# Patient Record
Sex: Female | Born: 1990 | Race: Black or African American | Hispanic: No | Marital: Single | Smoking: Never smoker
Health system: Southern US, Community
[De-identification: ages and names within clinical notes are randomized; demographics above are authoritative.]

## PROBLEM LIST (undated history)

## (undated) ENCOUNTER — Inpatient Hospital Stay: Payer: Self-pay

## (undated) DIAGNOSIS — O99210 Obesity complicating pregnancy, unspecified trimester: Secondary | ICD-10-CM

## (undated) DIAGNOSIS — R7303 Prediabetes: Secondary | ICD-10-CM

## (undated) DIAGNOSIS — I10 Essential (primary) hypertension: Secondary | ICD-10-CM

## (undated) DIAGNOSIS — O139 Gestational [pregnancy-induced] hypertension without significant proteinuria, unspecified trimester: Secondary | ICD-10-CM

## (undated) DIAGNOSIS — Z789 Other specified health status: Secondary | ICD-10-CM

## (undated) HISTORY — PX: NO PAST SURGERIES: SHX2092

## (undated) HISTORY — DX: Gestational (pregnancy-induced) hypertension without significant proteinuria, unspecified trimester: O13.9

## (undated) HISTORY — DX: Essential (primary) hypertension: I10

## (undated) HISTORY — DX: Prediabetes: R73.03

---

## 2011-03-27 ENCOUNTER — Emergency Department: Payer: Self-pay | Admitting: Internal Medicine

## 2012-10-05 IMAGING — CT CT CERVICAL SPINE WITHOUT CONTRAST
2 series · 15 of 20 positions shown, 18 images · non-contrast
Comparison: none

REASON FOR EXAM: neck pain
COMMENTS:

PROCEDURE:     CT  - CT CERVICAL SPINE WO  - March 27, 2011  [DATE]
RESULT:     CT cervical spine
TECHNIQUE: Helical 2 mm sections were obtained. Reconstructions were
performed utilizing a bone algorithm in coronal, sagittal, and axial planes.

[Series 5: coronal · coronal · 0.34mm/px · 3 of 52 slices shown]
[im 11/52  bone]
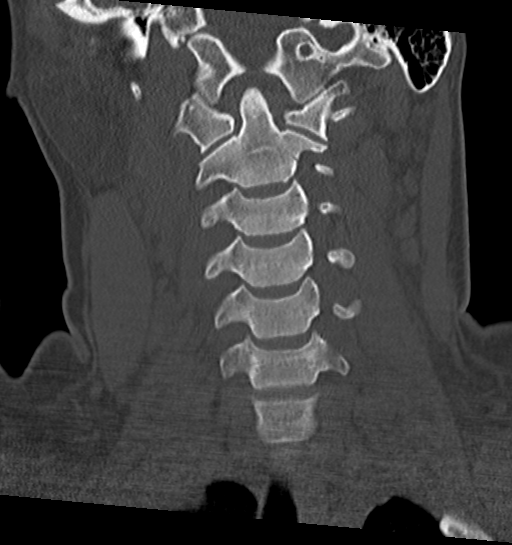
[im 21/52  bone]
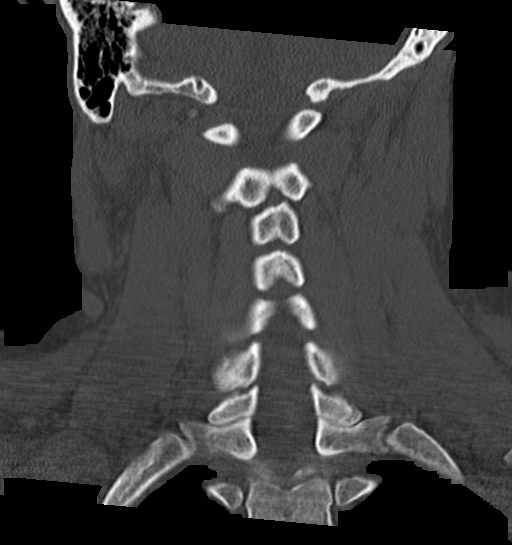
[im 31/52  bone]
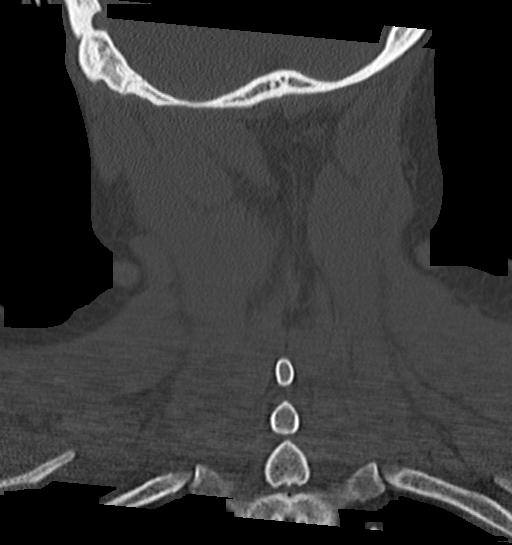

[Series 7: axial · axial · 0.31mm/px · z∈[-730,-596]mm · 12 of 81 slices shown, 15 images]
[im 7/81  soft-tissue]
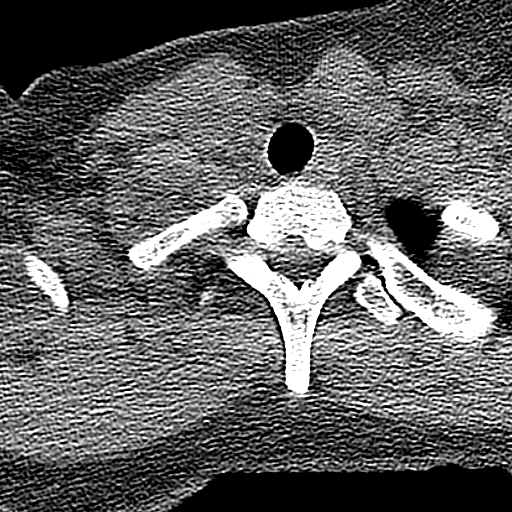
[im 7/81  bone]
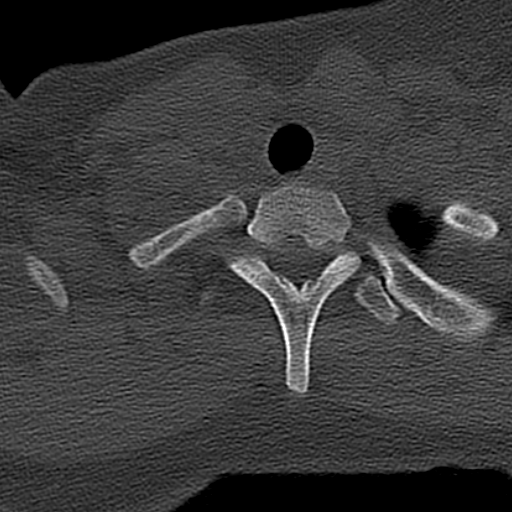
[im 13/81  bone]
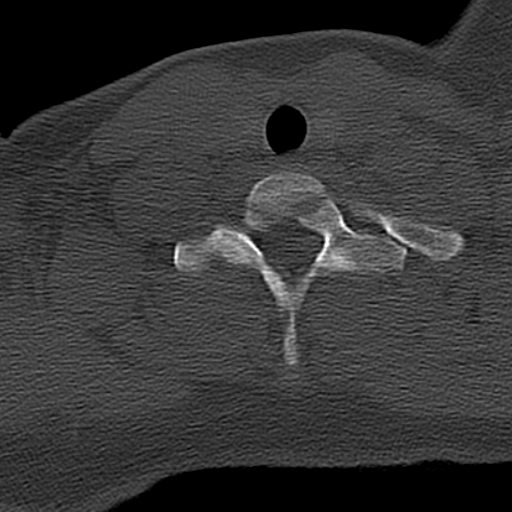
[im 19/81  bone]
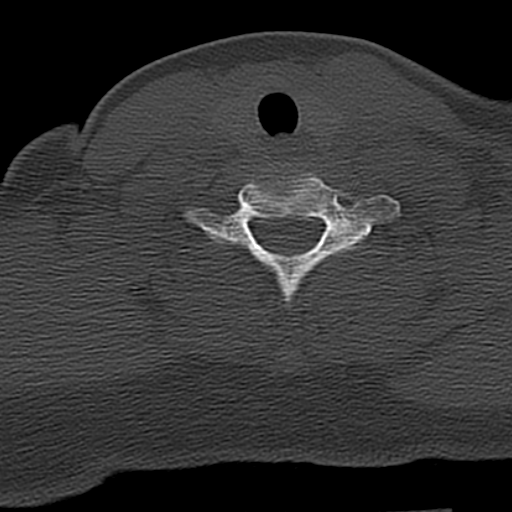
[im 25/81  bone]
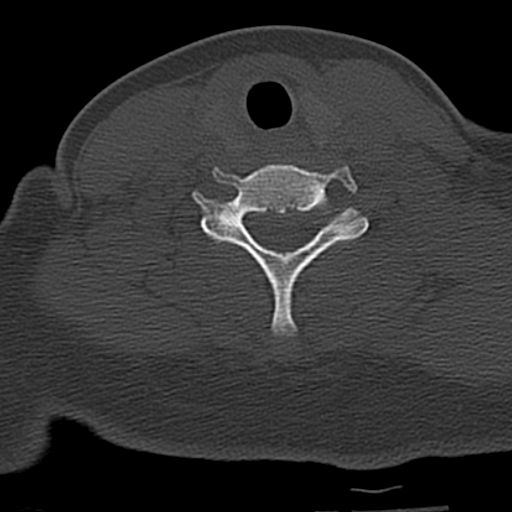
[im 31/81  soft-tissue]
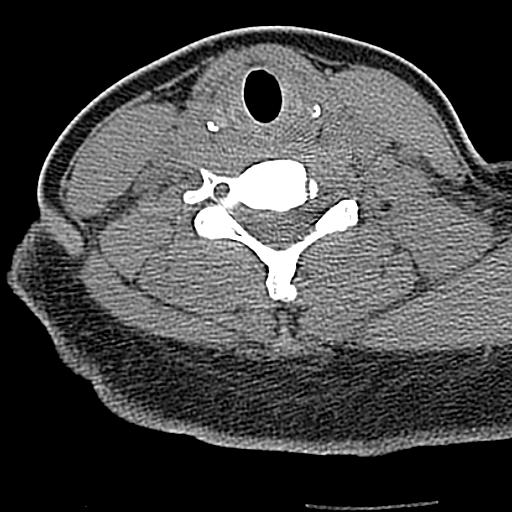
[im 31/81  bone]
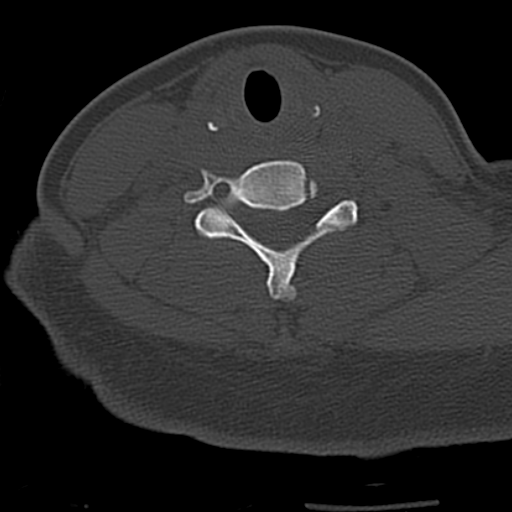
[im 37/81  bone]
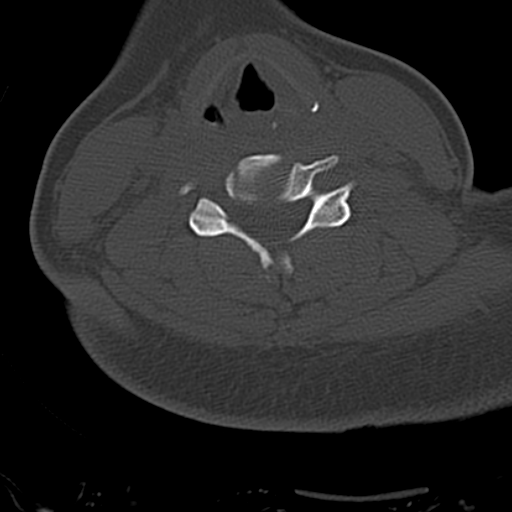
[im 44/81  bone]
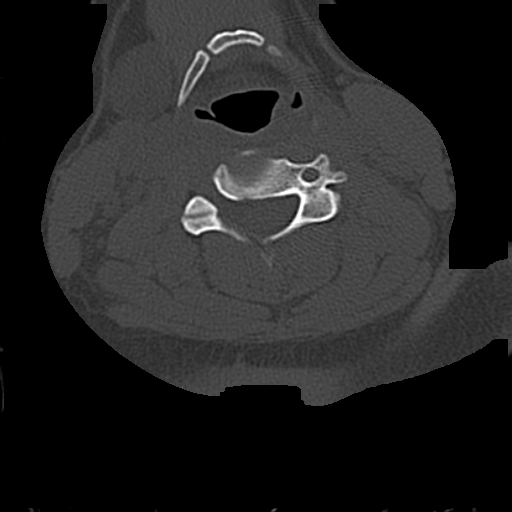
[im 50/81  bone]
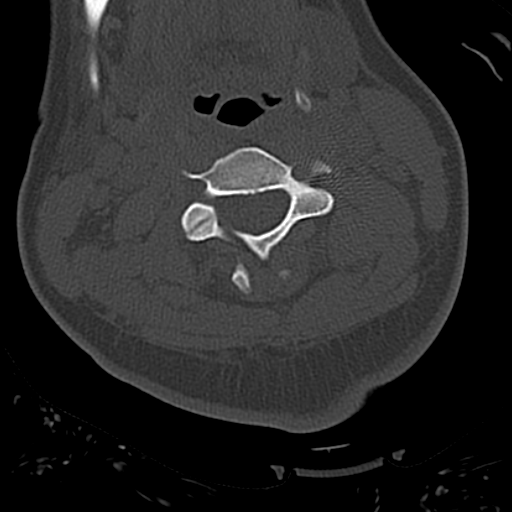
[im 56/81  soft-tissue]
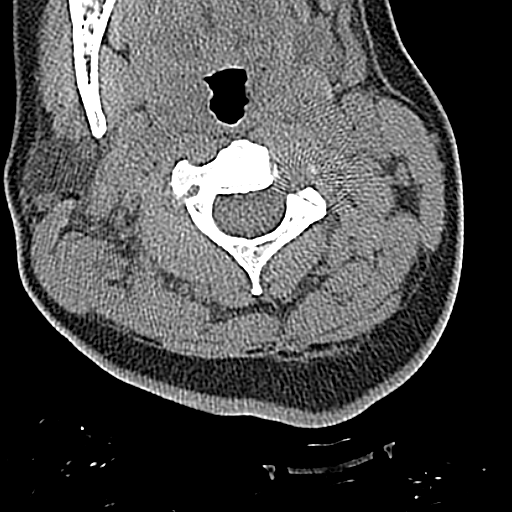
[im 56/81  bone]
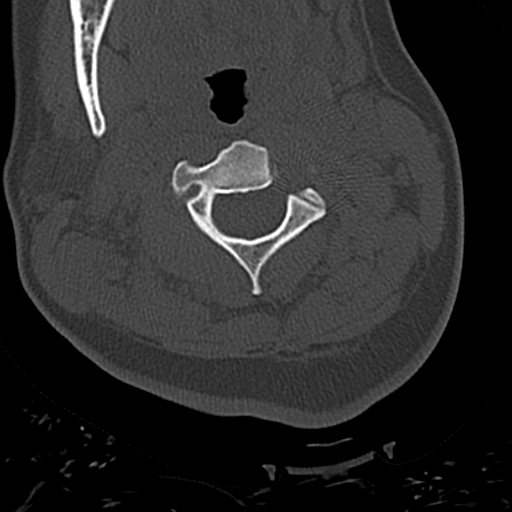
[im 62/81  bone]
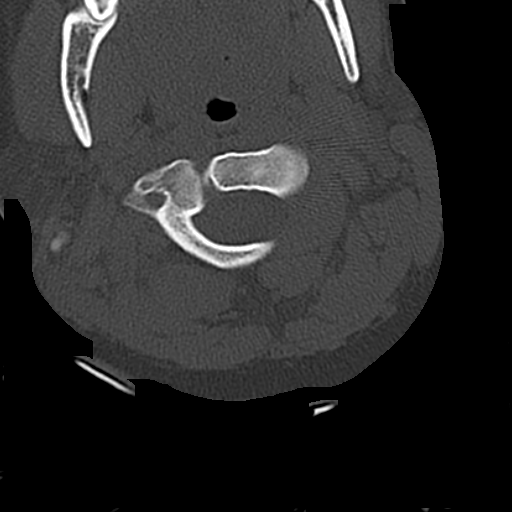
[im 68/81  bone]
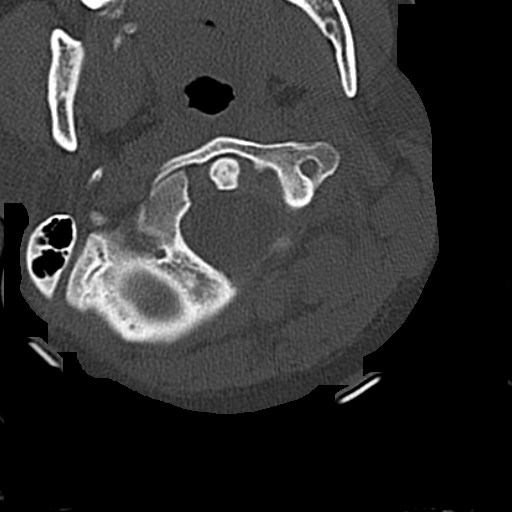
[im 74/81  bone]
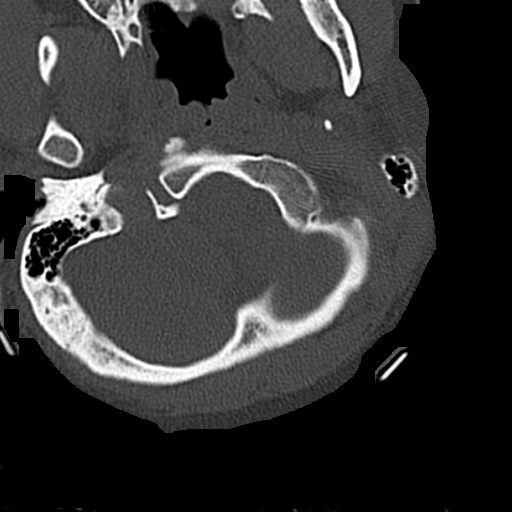

[15 of 20 positions shown; findings below may reference images not displayed]

FINDINGS: There is no evidence of acute fracture, dislocation, or
malalignment. There is no evidence of prevertebral soft tissue swelling nor
evidence of canal stenosis.
IMPRESSION: No CT evidence of acute osseous abnormalities.

## 2014-07-09 ENCOUNTER — Emergency Department: Payer: Self-pay | Admitting: Emergency Medicine

## 2015-02-14 LAB — OB RESULTS CONSOLE HEPATITIS B SURFACE ANTIGEN: Hepatitis B Surface Ag: NEGATIVE

## 2015-02-14 LAB — OB RESULTS CONSOLE GC/CHLAMYDIA
CHLAMYDIA, DNA PROBE: NEGATIVE
Gonorrhea: NEGATIVE

## 2015-03-07 LAB — OB RESULTS CONSOLE RUBELLA ANTIBODY, IGM: Rubella: IMMUNE

## 2015-03-07 LAB — OB RESULTS CONSOLE VARICELLA ZOSTER ANTIBODY, IGG: Varicella: IMMUNE

## 2015-05-12 DIAGNOSIS — O99210 Obesity complicating pregnancy, unspecified trimester: Secondary | ICD-10-CM | POA: Insufficient documentation

## 2015-05-12 HISTORY — DX: Obesity complicating pregnancy, unspecified trimester: O99.210

## 2015-05-12 NOTE — L&D Delivery Note (Signed)
Delivery Note At 10:04 AM a viable female was delivered via Vaginal, Spontaneous Delivery (Presentation: ; Occiput Anterior).  APGAR: 8, 9; weight  .   Placenta status: Intact, Spontaneous.  Cord: 3 vessels with the following complications: None.  Cord pH: n/a  Anesthesia: Epidural  Episiotomy: None Lacerations: None Suture Repair: none Est. Blood Loss (mL): 200  Mom to postpartum.  Baby to Couplet care / Skin to Skin.  Quick second stage after laboring down for over an hour. Infant to maternal abdomen for delayed cord clamping, cut by FOB after pulsation stopped. Infant to skin-to-skin with mother.   Baby's Name: August  Brandilyn Nanninga 09/11/2015, 10:25 AM

## 2015-06-13 LAB — OB RESULTS CONSOLE RPR: RPR: NONREACTIVE

## 2015-06-13 LAB — OB RESULTS CONSOLE HIV ANTIBODY (ROUTINE TESTING): HIV: NONREACTIVE

## 2015-08-09 ENCOUNTER — Other Ambulatory Visit: Payer: Self-pay | Admitting: Advanced Practice Midwife

## 2015-08-09 DIAGNOSIS — E348 Other specified endocrine disorders: Secondary | ICD-10-CM

## 2015-08-19 ENCOUNTER — Ambulatory Visit
Admission: RE | Admit: 2015-08-19 | Discharge: 2015-08-19 | Disposition: A | Discharge status: Home / Self Care | Payer: Medicaid Other | Source: Ambulatory Visit | Attending: Maternal & Fetal Medicine | Admitting: Maternal & Fetal Medicine

## 2015-08-19 ENCOUNTER — Ambulatory Visit: Payer: Self-pay

## 2015-08-19 DIAGNOSIS — E348 Other specified endocrine disorders: Secondary | ICD-10-CM | POA: Insufficient documentation

## 2015-08-19 DIAGNOSIS — Z3A36 36 weeks gestation of pregnancy: Secondary | ICD-10-CM | POA: Diagnosis not present

## 2015-08-19 DIAGNOSIS — O26893 Other specified pregnancy related conditions, third trimester: Secondary | ICD-10-CM | POA: Insufficient documentation

## 2015-08-19 HISTORY — DX: Obesity complicating pregnancy, unspecified trimester: O99.210

## 2015-08-22 LAB — OB RESULTS CONSOLE GBS: STREP GROUP B AG: NEGATIVE

## 2015-08-28 ENCOUNTER — Emergency Department
Admission: EM | Admit: 2015-08-28 | Discharge: 2015-08-29 | Disposition: A | Discharge status: Home / Self Care | Payer: Medicaid Other | Source: Home / Self Care | Attending: Emergency Medicine | Admitting: Emergency Medicine

## 2015-08-28 ENCOUNTER — Encounter: Payer: Self-pay | Admitting: *Deleted

## 2015-08-28 DIAGNOSIS — E669 Obesity, unspecified: Secondary | ICD-10-CM | POA: Diagnosis not present

## 2015-08-28 DIAGNOSIS — O99713 Diseases of the skin and subcutaneous tissue complicating pregnancy, third trimester: Secondary | ICD-10-CM | POA: Diagnosis not present

## 2015-08-28 DIAGNOSIS — Z6841 Body Mass Index (BMI) 40.0 and over, adult: Secondary | ICD-10-CM | POA: Diagnosis not present

## 2015-08-28 DIAGNOSIS — O1203 Gestational edema, third trimester: Secondary | ICD-10-CM

## 2015-08-28 DIAGNOSIS — Z3A37 37 weeks gestation of pregnancy: Secondary | ICD-10-CM | POA: Insufficient documentation

## 2015-08-28 DIAGNOSIS — R21 Rash and other nonspecific skin eruption: Secondary | ICD-10-CM | POA: Insufficient documentation

## 2015-08-28 DIAGNOSIS — O99213 Obesity complicating pregnancy, third trimester: Secondary | ICD-10-CM | POA: Insufficient documentation

## 2015-08-28 LAB — CBC
HCT: 34.3 % — ABNORMAL LOW (ref 35.0–47.0)
HEMOGLOBIN: 11.3 g/dL — AB (ref 12.0–16.0)
MCH: 25.7 pg — ABNORMAL LOW (ref 26.0–34.0)
MCHC: 33 g/dL (ref 32.0–36.0)
MCV: 77.9 fL — ABNORMAL LOW (ref 80.0–100.0)
PLATELETS: 260 10*3/uL (ref 150–440)
RBC: 4.41 MIL/uL (ref 3.80–5.20)
RDW: 14.4 % (ref 11.5–14.5)
WBC: 14.3 10*3/uL — AB (ref 3.6–11.0)

## 2015-08-28 LAB — COMPREHENSIVE METABOLIC PANEL
ALBUMIN: 2.8 g/dL — AB (ref 3.5–5.0)
ALK PHOS: 101 U/L (ref 38–126)
ALT: 25 U/L (ref 14–54)
ANION GAP: 8 (ref 5–15)
AST: 32 U/L (ref 15–41)
BUN: 10 mg/dL (ref 6–20)
CALCIUM: 9.2 mg/dL (ref 8.9–10.3)
CHLORIDE: 105 mmol/L (ref 101–111)
CO2: 22 mmol/L (ref 22–32)
CREATININE: 0.72 mg/dL (ref 0.44–1.00)
GFR calc Af Amer: >60 mL/min (ref >60)
GFR calc non Af Amer: >60 mL/min (ref >60)
GLUCOSE: 109 mg/dL — AB (ref 65–99)
Potassium: 3.8 mmol/L (ref 3.5–5.1)
SODIUM: 135 mmol/L (ref 135–145)
Total Bilirubin: 0.5 mg/dL (ref 0.3–1.2)
Total Protein: 6.8 g/dL (ref 6.5–8.1)

## 2015-08-28 NOTE — ED Provider Notes (Signed)
[facility] Emergency Department Provider Note  ____________________________________________  Time seen: 11:10 PM  I have reviewed the triage vital signs and the nursing notes.   HISTORY  Chief Complaint Leg Swelling      HPI Nichole Lang is a 25 y.o. female Gravida 1 para 0 recent weeks pregnant presents to the emergency department with bilateral extremity swelling that has progressed over the past 2 days accompanied by a "stinging rash". Patient denies any abdominal or pelvic discomfort no vaginal bleeding. Patient denies any chest pain or shortness of breath. Patient denies any personal or family history of DVT or PE. Patient states that the pregnancy has been uneventful thus far.   Past Medical History  Diagnosis Date  . Obesity affecting pregnancy 2017    There are no active problems to display for this patient.   Past Surgical History  Procedure Laterality Date  . No past surgeries      Current Outpatient Rx  Name  Route  Sig  Dispense  Refill  . Prenat w/o A Vit-FeFum-FePo-FA (PROVIDA OB PO)   Oral   Take 1 tablet by mouth daily.           Allergies No known drug allergies No family history on file.  Social History Social History  Substance Use Topics  . Smoking status: Never Smoker   . Smokeless tobacco: Never Used  . Alcohol Use: No    Review of Systems  Constitutional: Negative for fever. Eyes: Negative for visual changes. ENT: Negative for sore throat. Cardiovascular: Negative for chest pain. Respiratory: Negative for shortness of breath. Gastrointestinal: Negative for abdominal pain, vomiting and diarrhea. Genitourinary: Negative for dysuria. Musculoskeletal: Negative for back pain.Positive for bilateral lower extremity swelling Skin: Positive for rash. Neurological: Negative for headaches, focal weakness or numbness.   10-point ROS otherwise  negative.  ____________________________________________   PHYSICAL EXAM:  VITAL SIGNS: ED Triage Vitals  Enc Vitals Group     BP 08/28/15 2046 135/82 mmHg     Pulse Rate 08/28/15 2046 88     Resp 08/28/15 2046 20     Temp 08/28/15 2046 98.3 F (36.8 C)     Temp Source 08/28/15 2046 Oral     SpO2 08/28/15 2046 98 %     Weight 08/28/15 2046 280 lb (127.007 kg)     Height 08/28/15 2046 5\' 7"  (1.702 m)     Head Cir --      Peak Flow --      Pain Score 08/28/15 2247 4     Pain Loc --      Pain Edu? --      Excl. in GC? --    Constitutional: Alert and oriented. Well appearing and in no distress. Eyes: Conjunctivae are normal. PERRL. Normal extraocular movements. ENT   Head: Normocephalic and atraumatic.   Nose: No congestion/rhinnorhea.   Mouth/Throat: Mucous membranes are moist.   Neck: No stridor. Hematological/Lymphatic/Immunilogical: No cervical lymphadenopathy. Cardiovascular: Normal rate, regular rhythm. Normal and symmetric distal pulses are present in all extremities. No murmurs, rubs, or gallops. Respiratory: Normal respiratory effort without tachypnea nor retractions. Breath sounds are clear and equal bilaterally. No wheezes/rales/rhonchi. Gastrointestinal: Soft and nontender. No distention. There is no CVA tenderness. Genitourinary: deferred Musculoskeletal: Nontender with normal range of motion in all extremities. No joint effusions.Bilateral lower extremity nonpitting edema  Neurologic:  Normal speech and language. No gross focal neurologic deficits are appreciated. Speech is normal.  Skin:  Skin is warm, dry and intact.  Positive for bilateral lower extremity nonblanching erythematous tender rash Psychiatric: Mood and affect are normal. Speech and behavior are normal. Patient exhibits appropriate insight and judgment.  ____________________________________________    LABS (pertinent positives/negatives)  Labs Reviewed  CBC - Abnormal; Notable for the  following:    WBC 14.3 (*)    Hemoglobin 11.3 (*)    HCT 34.3 (*)    MCV 77.9 (*)    MCH 25.7 (*)    All other components within normal limits  COMPREHENSIVE METABOLIC PANEL - Abnormal; Notable for the following:    Glucose, Bld 109 (*)    Albumin 2.8 (*)    All other components within normal limits       RADIOLOGY US Venous Img Lower Bilateral (Final result) Result time: 08/29/15 02:35:09   Final result by Rad Results In Interface (08/29/15 02:35:09)   Narrative:   CLINICAL DATA: Pregnant patient in third trimester pregnancy with bilateral leg swelling.  EXAM: BILATERAL LOWER EXTREMITY VENOUS DOPPLER ULTRASOUND  TECHNIQUE: Gray-scale sonography with graded compression, as well as color Doppler and duplex ultrasound were performed to evaluate the lower extremity deep venous systems from the level of the common femoral vein and including the common femoral, femoral, profunda femoral, popliteal and calf veins including the posterior tibial, peroneal and gastrocnemius veins when visible. The superficial great saphenous vein was also interrogated. Spectral Doppler was utilized to evaluate flow at rest and with distal augmentation maneuvers in the common femoral, femoral and popliteal veins.  COMPARISON: None.  FINDINGS: RIGHT LOWER EXTREMITY  Common Femoral Vein: No evidence of thrombus. Normal compressibility, respiratory phasicity and response to augmentation.  Saphenofemoral Junction: No evidence of thrombus. Normal compressibility and flow on color Doppler imaging.  Profunda Femoral Vein: No evidence of thrombus. Normal compressibility and flow on color Doppler imaging.  Femoral Vein: No evidence of thrombus. Normal compressibility, respiratory phasicity and response to augmentation.  Popliteal Vein: No evidence of thrombus. Normal compressibility, respiratory phasicity and response to augmentation.  Calf Veins: No evidence of thrombus. Normal  compressibility and flow on color Doppler imaging.  Superficial Great Saphenous Vein: No evidence of thrombus. Normal compressibility and flow on color Doppler imaging.  Venous Reflux: None.  Other Findings: None.  LEFT LOWER EXTREMITY  Common Femoral Vein: No evidence of thrombus. Normal compressibility, respiratory phasicity and response to augmentation.  Saphenofemoral Junction: No evidence of thrombus. Normal compressibility and flow on color Doppler imaging.  Profunda Femoral Vein: No evidence of thrombus. Normal compressibility and flow on color Doppler imaging.  Femoral Vein: No evidence of thrombus. Normal compressibility, respiratory phasicity and response to augmentation.  Popliteal Vein: No evidence of thrombus. Normal compressibility, respiratory phasicity and response to augmentation.  Calf Veins: No evidence of thrombus. Normal compressibility and flow on color Doppler imaging.  Superficial Great Saphenous Vein: No evidence of thrombus. Normal compressibility and flow on color Doppler imaging.  Venous Reflux: None.  Other Findings: None.  IMPRESSION: No evidence of bilateral lower extremity deep venous thrombosis.   Electronically Signed By: Rubye Oaks M.D. On: 08/29/2015 02:35       INITIAL IMPRESSION / ASSESSMENT AND PLAN / ED COURSE  Pertinent labs & imaging results that were available during my care of the patient were reviewed by me and considered in my medical decision making (see chart for details).  Suspect bilateral extremity rash to be Purigo. Patient discussed with [facility] nurse practitioner Sharen Hones who accepted the patient to be discharged to labor and delivery  ____________________________________________  FINAL CLINICAL IMPRESSION(S) / ED DIAGNOSES  Final diagnoses:  Edema in pregnancy in third trimester      Darci Current, MD 08/29/15 [id number]

## 2015-08-28 NOTE — ED Notes (Signed)
Pt is [redacted] weeks pregnant.  Pt has swelling and rash to both lower legs for 2 days.  No abd pain. No vag bleeding. No chest pain.  No sob.  [id number]

## 2015-08-29 ENCOUNTER — Observation Stay
Admission: RE | Admit: 2015-08-29 | Discharge: 2015-08-29 | Disposition: A | Discharge status: Home / Self Care | Payer: Medicaid Other | Attending: Certified Nurse Midwife | Admitting: Certified Nurse Midwife

## 2015-08-29 ENCOUNTER — Emergency Department: Payer: Medicaid Other

## 2015-08-29 DIAGNOSIS — O1203 Gestational edema, third trimester: Secondary | ICD-10-CM | POA: Diagnosis present

## 2015-08-29 HISTORY — DX: Other specified health status: Z78.9

## 2015-08-29 NOTE — Discharge Instructions (Signed)
Peripheral Edema You have swelling in your legs (peripheral edema). This swelling is due to excess accumulation of salt and water in your body. Edema may be a sign of heart, kidney or liver disease, or a side effect of a medication. It may also be due to problems in the leg veins. Elevating your legs and using special support stockings may be very helpful, if the cause of the swelling is due to poor venous circulation. Avoid long periods of standing, whatever the cause. Treatment of edema depends on identifying the cause. Chips, pretzels, pickles and other salty foods should be avoided. Restricting salt in your diet is almost always needed. Water pills (diuretics) are often used to remove the excess salt and water from your body via urine. These medicines prevent the kidney from reabsorbing sodium. This increases urine flow. Diuretic treatment may also result in lowering of potassium levels in your body. Potassium supplements may be needed if you have to use diuretics daily. Daily weights can help you keep track of your progress in clearing your edema. You should call your caregiver for follow up care as recommended. SEEK IMMEDIATE MEDICAL CARE IF:   You have increased swelling, pain, redness, or heat in your legs.  You develop shortness of breath, especially when lying down.  You develop chest or abdominal pain, weakness, or fainting.  You have a fever.   This information is not intended to replace advice given to you by your health care provider. Make sure you discuss any questions you have with your health care provider.   Document Released: 06/04/2004 Document Revised: 07/20/2011 Document Reviewed: 11/07/2014 Elsevier Interactive Patient Education 2016 Else[employer] Inc[employer]  Skin Conditions During Pregnancy Pregnancy affects many parts of your body. One part is your skin. Most skin problems that develop during pregnancy are not serious and are considered a normal part of pregnancy. They go away on  their own after the baby is born. Other skin problems may need treatment.  WHAT TYPE OF SKIN PROBLEMS CAN DEVELOP DURING PREGNANCY?  Stretch marks. Stretch marks are purple or pink lines on the skin. They may appear on the belly, breasts, thighs, or buttocks. Stretch marks are caused by weight gain that causes the skin to stretch. Stretch marks do not cause problems. Almost all women get them during pregnancy.  Darkening of the skin (hyperpigmentation). The darkening may occur in patches or as a line. Patches may appear on the face, nipples, or genital area. Lines often stretch from the belly button to the pubic area. Hyperpigmentation develops in almost all pregnant women. It is more severe in women with a dark complexion.  Spider angiomas. These are tiny pink or red lines that go out from a center point, like the legs of a spider. Usually, they are on the face, neck, and arms. They do not cause problems. They are most common in women with light complexions.  Palmar erythema. This is a reddening of the palms. It is most common in women with light complexions.  Swelling and redness. This can occur on the face, eyelids, fingers, or toes.  Pruritic urticarial papules and plaques of pregnancy (PUPPP). This is a rash that is itchy, red, and has tiny blisters. The cause is unknown. It usually starts on the abdomen and may affect the arms or legs. It does not affect the face. It usually begins later in pregnancy. About a third of all pregnant women develop this condition. There are no associated problems to the fetus with this rash.  Sometimes, oral steroids are used to calm down the itch. The rash clears after the baby is born.  Prurigo of pregnancy. This is a disease in which red patches and bumps appear on the arms and legs. The cause is unknown. The patches and bumps clear after the baby is born. About a third of pregnant women develop this disease.  Acne. Pimples may develop, including in women who  have had clear skin for a long time.  Skin tags. These are small flaps of skin that stick out from the body. They may grow or become darker during pregnancy. They are usually harmless.  Moles. These are flat or slightly raised growths. They are usually round and pink or Nichole Lang. They may grow or become darker during pregnancy.  Intrahepatic cholestasis of pregnancy. This is a rare condition that causes itchy skin. It may run in families. It increases the risk of complications for the fetus. This condition usually resolves after delivery. It can recur with subsequent pregnancies.  Impetigo herpetiformis. This is a form of a severe skin disease called pustular psoriasis. Usually, delivery is the only method of resolving the condition.  Pruritic folliculitis of pregnancy. This is a rare condition that causes pimple-like skin growths. It develops in the middle or later stages of pregnancy. Its cause is unknown.It usually resolves 2-3 weeks after delivery.  Pemphigoid gestationis. This is a very rare autoimmune disease. It causes a severely itchy rash and blisters. The rash does not appear on the face, scalp, or inside of the mouth. It usually resolves 3 months after delivery. It may recur with subsequent pregnancies. Some pre-existing skin conditions, such as atopic dermatitis, may become worse during pregnancy.  HOME CARE INSTRUCTIONS  Different conditions may have different instructions. In general:  Follow all your health care provider's directions about medicines to treat skin problems while you are pregnant. Do not use any over-the-counter medicines (including medicated creams and lotions) until you have checked with your health care provider. Many medicines are not safe to use when you are pregnant.  Avoid time in the sun. This will help keep your skin from darkening. When you must be outside, use sunscreen and wear a hat with a wide brim to protect your face. The sunscreen should have a SPF of  at least 15. This may help limit dark spots that develop when the skin is exposed to the sun.  To avoid problems from stretched skin:  Do not sit or stand for long periods of time.  Exercise regularly. This helps keep your skin in good condition.  Use a gentle soap. This helps prevent acne.  Do not get too hot or too sweaty. This makes some skin rashes worse.  Wear loose clothes made of a soft fabric. This prevents skin irritation.  For itching, add oatmeal or cornstarch to your bathwater.  Use a skin moisturizer. Ask your health care provider for suggestions.   This information is not intended to replace advice given to you by your health care provider. Make sure you discuss any questions you have with your health care provider.   Document Released: 05/30/2010 Document Revised: 05/18/2014 Document Reviewed: 02/06/2013 Elsevier Interactive Patient Education [employer]

## 2015-08-29 NOTE — Discharge Instructions (Signed)
Lab results reviewed, ultrasound to evaluate for DVT done in ER, neg. NST reactive. Pt confirms active fetal movement, denies feeling contractions. VSS, afebrile. Deferred vaginal exam since pt has not c/o labor signs/symptoms. Discharge teaching with labor precautions reviewed with pt and family present. Encouraged pt to drink plenty fluids throughout the day and night to stay well hydrated, keep legs elevated above level of heart whenever possible throughout the day. Avoid strenuous activity and soak ankle in Epson Salts once or twice daily. Obtain maternity disability paperwork and provide to OB to have paperwork completed. Understanding verbalized.

## 2015-08-29 NOTE — OB Triage Note (Signed)
Pt presents to OBS with c/o worsening edema in both legs and feet which was present for the past 2-3 weeks causing pain to touch and with ambulating/standing, rates pain 4-6/10, burning sensation. Noticed reddened rash on both legs and ankles that has been spreading and looks worse over the past couple days. Says she was seen in ER tonight, had lab work and ultrasound done and was cleared for DVT. 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 Confirms active fetal movement and has back pain which is normal for her, denies ha pain, dizziness, vision changes, sob, chest pain, urinary sym, unusual vaginal discharge, spotting, leaking or gush of fluid, fever, chill, n/v/d. GBS collected in clinic last visit, pt says she was not told results.

## 2015-08-29 NOTE — Final Progress Note (Signed)
Physician Final Progress Note  Patient ID: Nichole Lang MRN: [id number] DOB/AGE: 27/08/1990 27 y.o.  Admit date: 08/29/2015 Admitting provider: Leola Brazilhelsea C Ward, MD Discharge date: 08/29/2015   Admission Diagnoses: IUP at 37wk4d Edema of lower extremities Rash lower extremities  Discharge Diagnoses:  SAME AS ABOVE  Consults: NONE  Significant Findings/ Diagnostic Studies:   25 YEAR OLD G1 P0 with EDC=09/15/2015 by a 12wk4d ultrasound presents at 37.3 weeks with complaints of painful lower extremity edema and rash. She was seen in the ER and a Doppler study was done to rule out DVT. There was no evidence of DVT. Blood pressures were normal. She was sent up to L&D for a NST which was reactive.  Rash is not pruritic and is only on her ankles. [facility] has been remarkable for obesity (BMI>40), mild anemia, an elevated 1 hour GTT with normal 3hr GTT, and concerns for fetal growth. Last ultrasound with DP 4/10 placed EFW in the 14th%. She is having NSTs and AFIs weekly now. Blood type AB neg and she received Rhogam at 28 weeks. Exam: BP 133/75 mmHg  Pulse 87  Temp(Src) 98.3 F (36.8 C) (Oral)  Ht 5\' 7"  (1.702 m)  Wt 127.007 kg (280 lb)  BMI 43.84 kg/m2  LMP  (LMP Unknown)  FHR: 135 with accelerations to 150s, moderate variability Lower extremities: edema of lower legs and feet with a red macular rash on the medial ankles  Procedures: NST-reactive  Discharge Condition: stable  Disposition: [name]  Diet: Regular diet  Discharge Activity: Activity as tolerated, can soak feet in water twice daily with epsom salt. Elevate feet as much as possible. Note given to start maternity leave.     Medication List    ASK your doctor about these medications        PROVIDA OB PO  Take 1 tablet by mouth daily.         Total time spent taking care of this patient: 15 minutes  Signed: Jolette Lana 08/29/2015, 5:32 AM

## 2015-08-30 ENCOUNTER — Observation Stay
Admission: EM | Admit: 2015-08-30 | Discharge: 2015-08-30 | Disposition: A | Discharge status: Home / Self Care | Payer: Medicaid Other | Attending: Advanced Practice Midwife | Admitting: Advanced Practice Midwife

## 2015-08-30 DIAGNOSIS — Z6841 Body Mass Index (BMI) 40.0 and over, adult: Secondary | ICD-10-CM | POA: Diagnosis not present

## 2015-08-30 DIAGNOSIS — O471 False labor at or after 37 completed weeks of gestation: Secondary | ICD-10-CM | POA: Diagnosis not present

## 2015-08-30 DIAGNOSIS — O1203 Gestational edema, third trimester: Principal | ICD-10-CM | POA: Insufficient documentation

## 2015-08-30 DIAGNOSIS — Z3A37 37 weeks gestation of pregnancy: Secondary | ICD-10-CM | POA: Insufficient documentation

## 2015-08-30 DIAGNOSIS — E669 Obesity, unspecified: Secondary | ICD-10-CM | POA: Insufficient documentation

## 2015-08-30 DIAGNOSIS — O99213 Obesity complicating pregnancy, third trimester: Secondary | ICD-10-CM | POA: Diagnosis not present

## 2015-08-30 DIAGNOSIS — O9989 Other specified diseases and conditions complicating pregnancy, childbirth and the puerperium: Secondary | ICD-10-CM | POA: Insufficient documentation

## 2015-08-30 DIAGNOSIS — R030 Elevated blood-pressure reading, without diagnosis of hypertension: Secondary | ICD-10-CM | POA: Diagnosis present

## 2015-08-30 LAB — PROTEIN / CREATININE RATIO, URINE
Creatinine, Urine: 96 mg/dL
Protein Creatinine Ratio: 0.16 mg/mg{Cre} — ABNORMAL HIGH (ref 0.00–0.15)
Total Protein, Urine: 15 mg/dL

## 2015-08-30 NOTE — OB Triage Note (Signed)
Ms. Nichole Lang here with elevated BP from office, reports previous admission for rash on lower extremities

## 2015-08-30 NOTE — Discharge Instructions (Signed)

## 2015-08-30 NOTE — Final Progress Note (Signed)
Obstetric History and Physical  Nichole Lang is a 25 y.o. G1P0 with Estimated Date of Delivery: 09/15/15 who presents at 50103w5d  presenting for elevated BP in office today of 132/90 & 150/100. Pt denies ha, visual changes. 1 prior elevated BP at 16 week OB visit. Patient states she has been having rare contractions, no vaginal bleeding, intact membranes, with active fetal movement.    Prenatal Course Source of Care: [facility]   Pregnancy complications or risks: Patient Active Problem List   Diagnosis Date Noted  . Elevated blood pressure reading 08/30/2015  . Edema during pregnancy in third trimester 08/29/2015   Obesity with BMI>40 Concern for IUGR, but [facility] Recent evaluation for LE swelling and rash - neg LE dopplars   Prenatal Transfer Tool   Past Medical History  Diagnosis Date  . Obesity affecting pregnancy 2017  . Medical history non-contributory     Past Surgical History  Procedure Laterality Date  . No past surgeries      OB History  Gravida Para Term Preterm AB SAB TAB Ectopic Multiple Living  1             # Outcome Date GA Lbr Len/2nd Weight Sex Delivery Anes PTL Lv  1 Current               Social History   Social History  . Marital Status: Single    Spouse Name: N/A  . Number of Children: N/A  . Years of Education: N/A   Social History Main Topics  . Smoking status: Never Smoker   . Smokeless tobacco: Never Used  . Alcohol Use: No  . Drug Use: No  . Sexual Activity: Not Currently   Other Topics Concern  . Not on file   Social History Narrative    No family history on file.  Prescriptions prior to admission  Medication Sig Dispense Refill Last Dose  . Prenat w/o A Vit-FeFum-FePo-FA (PROVIDA OB PO) Take 1 tablet by mouth daily.   08/28/2015 at Unknown time    Allergies  Allergen Reactions  . No Known Allergies     Review of Systems: Negative except for what is mentioned in HPI.  Physical Exam: BPs: normal to mild  range except 1 diastolic of 92 BP 127/78 mmHg  Pulse 87  Temp(Src) 98.2 F (36.8 C) (Oral)  Ht 5\' 7"  (1.702 m)  Wt 283 lb (128.368 kg)  BMI 44.31 kg/m2  LMP  (LMP Unknown) GENERAL: Well-developed, well-nourished female in no acute distress.  ABDOMEN: Soft, nontender, nondistended, gravid. EXTREMITIES: Nontender, no edema Cervical Exam: Dilatation 2-2.5cm   Effacement 70%   Station -3, posterior   Presentation: cephalic FHT: Category: 1 Baseline rate 130 bpm   Variability moderate  Accelerations present   Decelerations none Contractions: rare   Pertinent Labs/Studies:   Results for orders placed or performed during the hospital encounter of 08/30/15 (from the past 24 hour(s))  Protein / creatinine ratio, urine     Status: Abnormal   Collection Time: 08/30/15 11:30 AM  Result Value Ref Range   Creatinine, Urine 96 mg/dL   Total Protein, Urine 15 mg/dL   Protein Creatinine Ratio 0.16 (H) 0.00 - 0.15 mg/mg[Cre]    Assessment : IUP at 64103w5d, no evidence of pre-eclampsia  Plan: Discharge Home  F/u at office as scheduled on 4/24 Pre-eclampsia and labor precautions

## 2015-09-10 ENCOUNTER — Inpatient Hospital Stay
Admission: EM | Admit: 2015-09-10 | Discharge: 2015-09-13 | DRG: 774 | Disposition: A | Discharge status: Home / Self Care | Payer: Medicaid Other | Attending: Advanced Practice Midwife | Admitting: Advanced Practice Midwife

## 2015-09-10 DIAGNOSIS — Z6841 Body Mass Index (BMI) 40.0 and over, adult: Secondary | ICD-10-CM | POA: Diagnosis not present

## 2015-09-10 DIAGNOSIS — E669 Obesity, unspecified: Secondary | ICD-10-CM | POA: Diagnosis present

## 2015-09-10 DIAGNOSIS — I10 Essential (primary) hypertension: Secondary | ICD-10-CM | POA: Diagnosis present

## 2015-09-10 DIAGNOSIS — Z3A39 39 weeks gestation of pregnancy: Secondary | ICD-10-CM

## 2015-09-10 DIAGNOSIS — O133 Gestational [pregnancy-induced] hypertension without significant proteinuria, third trimester: Secondary | ICD-10-CM | POA: Diagnosis present

## 2015-09-10 DIAGNOSIS — O1092 Unspecified pre-existing hypertension complicating childbirth: Secondary | ICD-10-CM | POA: Diagnosis present

## 2015-09-10 DIAGNOSIS — O169 Unspecified maternal hypertension, unspecified trimester: Secondary | ICD-10-CM | POA: Diagnosis present

## 2015-09-10 DIAGNOSIS — O99214 Obesity complicating childbirth: Secondary | ICD-10-CM | POA: Diagnosis present

## 2015-09-10 DIAGNOSIS — O134 Gestational [pregnancy-induced] hypertension without significant proteinuria, complicating childbirth: Secondary | ICD-10-CM | POA: Diagnosis present

## 2015-09-10 LAB — COMPREHENSIVE METABOLIC PANEL
ALBUMIN: 2.9 g/dL — AB (ref 3.5–5.0)
ALK PHOS: 105 U/L (ref 38–126)
ALT: 14 U/L (ref 14–54)
ANION GAP: 8 (ref 5–15)
AST: 23 U/L (ref 15–41)
BUN: 7 mg/dL (ref 6–20)
CALCIUM: 9.5 mg/dL (ref 8.9–10.3)
CHLORIDE: 104 mmol/L (ref 101–111)
CO2: 22 mmol/L (ref 22–32)
Creatinine, Ser: 0.71 mg/dL (ref 0.44–1.00)
GFR calc non Af Amer: >60 mL/min (ref >60)
GLUCOSE: 89 mg/dL (ref 65–99)
Potassium: 3.8 mmol/L (ref 3.5–5.1)
SODIUM: 134 mmol/L — AB (ref 135–145)
Total Bilirubin: 0.3 mg/dL (ref 0.3–1.2)
Total Protein: 7.2 g/dL (ref 6.5–8.1)

## 2015-09-10 LAB — CBC
HCT: 34.6 % — ABNORMAL LOW (ref 35.0–47.0)
HEMOGLOBIN: 11.6 g/dL — AB (ref 12.0–16.0)
MCH: 26 pg (ref 26.0–34.0)
MCHC: 33.6 g/dL (ref 32.0–36.0)
MCV: 77.2 fL — AB (ref 80.0–100.0)
PLATELETS: 237 10*3/uL (ref 150–440)
RBC: 4.48 MIL/uL (ref 3.80–5.20)
RDW: 14.7 % — ABNORMAL HIGH (ref 11.5–14.5)
WBC: 20.8 10*3/uL — AB (ref 3.6–11.0)

## 2015-09-10 LAB — TYPE AND SCREEN
ABO/RH(D): AB NEG
Antibody Screen: NEGATIVE

## 2015-09-10 MED ORDER — CITRIC ACID-SODIUM CITRATE 334-500 MG/5ML PO SOLN: 30.0000 mL | ORAL | Status: DC | PRN

## 2015-09-10 MED ORDER — ONDANSETRON HCL 4 MG/2ML IJ SOLN: 4.0000 mg | Freq: Four times a day (QID) | INTRAMUSCULAR | Status: DC | PRN

## 2015-09-10 MED ORDER — OXYTOCIN BOLUS FROM INFUSION
500.0000 mL | INTRAVENOUS | Status: DC
  Administered 2015-09-11: 500 mL via INTRAVENOUS

## 2015-09-10 MED ORDER — BUTORPHANOL TARTRATE 1 MG/ML IJ SOLN
1.0000 mg | INTRAMUSCULAR | Status: DC | PRN
  Administered 2015-09-11 (×2): 1 mg via INTRAVENOUS
  Filled 2015-09-10 (×2): qty 1

## 2015-09-10 MED ORDER — LACTATED RINGERS IV SOLN
INTRAVENOUS | Status: DC
  Administered 2015-09-10 – 2015-09-11 (×2): via INTRAVENOUS

## 2015-09-10 MED ORDER — TERBUTALINE SULFATE 1 MG/ML IJ SOLN: 0.2500 mg | Freq: Once | INTRAMUSCULAR | Status: DC | PRN

## 2015-09-10 MED ORDER — AMMONIA AROMATIC IN INHA
RESPIRATORY_TRACT | Status: AC
  Filled 2015-09-10: qty 10

## 2015-09-10 MED ORDER — LIDOCAINE HCL (PF) 1 % IJ SOLN
INTRAMUSCULAR | Status: AC
  Filled 2015-09-10: qty 30

## 2015-09-10 MED ORDER — ZOLPIDEM TARTRATE 5 MG PO TABS
5.0000 mg | ORAL_TABLET | Freq: Every evening | ORAL | Status: DC | PRN
  Administered 2015-09-11: 5 mg via ORAL
  Filled 2015-09-10: qty 1

## 2015-09-10 MED ORDER — OXYTOCIN 10 UNIT/ML IJ SOLN
INTRAMUSCULAR | Status: AC
  Filled 2015-09-10: qty 2

## 2015-09-10 MED ORDER — ACETAMINOPHEN 325 MG PO TABS: 650.0000 mg | ORAL_TABLET | ORAL | Status: DC | PRN

## 2015-09-10 MED ORDER — DINOPROSTONE 10 MG VA INST
10.0000 mg | VAGINAL_INSERT | Freq: Once | VAGINAL | Status: AC
  Administered 2015-09-10: 10 mg via VAGINAL
  Filled 2015-09-10: qty 1

## 2015-09-10 MED ORDER — MISOPROSTOL 200 MCG PO TABS
ORAL_TABLET | ORAL | Status: AC
  Filled 2015-09-10: qty 4

## 2015-09-10 MED ORDER — OXYTOCIN 40 UNITS IN LACTATED RINGERS INFUSION - SIMPLE MED
2.5000 [IU]/h | INTRAVENOUS | Status: DC
  Administered 2015-09-11: 2.5 [IU]/h via INTRAVENOUS
  Filled 2015-09-10: qty 1000

## 2015-09-10 MED ORDER — LIDOCAINE HCL (PF) 1 % IJ SOLN: 30.0000 mL | INTRAMUSCULAR | Status: DC | PRN

## 2015-09-10 MED ORDER — LACTATED RINGERS IV SOLN
500.0000 mL | INTRAVENOUS | Status: DC | PRN
  Administered 2015-09-11: 500 mL via INTRAVENOUS

## 2015-09-10 NOTE — H&P (Signed)
Obstetric H&P   Chief Complaint: IOL  Prenatal Care Provider: [facility]  History of Present Illness: 25 y.o. G1P0 249w2d by 09/15/2015, here for IOL for obesity and CHTN vs GHTN.  +FM, no LOF, no VB, no ctx.  AB neg / ABSC neg / RI / VZI / HBsAg neg / RPR NR / HIV neg / GBS negative  Review of Systems: 10 point review of systems negative unless otherwise noted in HPI  Past Medical History: Past Medical History  Diagnosis Date  . Obesity affecting pregnancy 2017  . Medical history non-contributory     Past Surgical History: Past Surgical History  Procedure Laterality Date  . No past surgeries      Family History: No family history on file.  Social History: Social History   Social History  . Marital Status: Single    Spouse Name: N/A  . Number of Children: N/A  . Years of Education: N/A   Occupational History  . Not on file.   Social History Main Topics  . Smoking status: Never Smoker   . Smokeless tobacco: Never Used  . Alcohol Use: No  . Drug Use: No  . Sexual Activity: Not Currently   Other Topics Concern  . Not on file   Social History Narrative    Medications: Prior to Admission medications   Medication Sig Start Date End Date Taking? Authorizing Provider  Prenat w/o A Vit-FeFum-FePo-FA (PROVIDA OB PO) Take 1 tablet by mouth daily.    Historical Provider, MD    Allergies: Allergies  Allergen Reactions  . No Known Allergies     Physical Exam: Vitals: There were no vitals taken for this visit.  Urine Dip Protein: P/C ratio pending  FHT: 150, moderate, positive accels, no decels Toco: none  General: NAD HEENT: normocephalic, anicteric Pulmonary: no increased work of breathing Abdomen: Gravid,  Non-tender Genitourinary: 2cm in clinic very posterior Extremities: no edema  Labs: No results found for this or any previous visit (from the past 24 hour(s)).  Assessment: 25 y.o. G1P0 409w2d by 09/15/2015, IOL CHTN/GHTN and obesity  Plan: 1) CHTN/GHTN  - PIH labs with admission labs - monitor  2) Fetus - cat I tracing  3) PNL - as documented above  4) IOL - cervidil  5) Disposition -pending delivery

## 2015-09-11 ENCOUNTER — Encounter: Payer: Self-pay | Admitting: Advanced Practice Midwife

## 2015-09-11 ENCOUNTER — Inpatient Hospital Stay: Payer: Medicaid Other | Admitting: Certified Registered Nurse Anesthetist

## 2015-09-11 DIAGNOSIS — O139 Gestational [pregnancy-induced] hypertension without significant proteinuria, unspecified trimester: Secondary | ICD-10-CM

## 2015-09-11 LAB — CBC
HCT: 34.8 % — ABNORMAL LOW (ref 35.0–47.0)
Hemoglobin: 11.5 g/dL — ABNORMAL LOW (ref 12.0–16.0)
MCH: 26.2 pg (ref 26.0–34.0)
MCHC: 33.2 g/dL (ref 32.0–36.0)
MCV: 78.8 fL — AB (ref 80.0–100.0)
PLATELETS: 204 10*3/uL (ref 150–440)
RBC: 4.41 MIL/uL (ref 3.80–5.20)
RDW: 14.9 % — AB (ref 11.5–14.5)
WBC: 18.4 10*3/uL — ABNORMAL HIGH (ref 3.6–11.0)

## 2015-09-11 LAB — ABO/RH: ABO/RH(D): AB NEG

## 2015-09-11 LAB — PROTEIN / CREATININE RATIO, URINE
CREATININE, URINE: 243 mg/dL
Protein Creatinine Ratio: 0.14 mg/mg{Cre} (ref 0.00–0.15)
TOTAL PROTEIN, URINE: 33 mg/dL

## 2015-09-11 MED ORDER — FENTANYL 2.5 MCG/ML W/ROPIVACAINE 0.2% IN NS 100 ML EPIDURAL INFUSION (ARMC-ANES)
EPIDURAL | Status: AC
  Filled 2015-09-11: qty 100

## 2015-09-11 MED ORDER — ONDANSETRON HCL 4 MG/2ML IJ SOLN: 4.0000 mg | INTRAMUSCULAR | Status: DC | PRN

## 2015-09-11 MED ORDER — FENTANYL 2.5 MCG/ML W/ROPIVACAINE 0.2% IN NS 100 ML EPIDURAL INFUSION (ARMC-ANES)
EPIDURAL | Status: DC | PRN
  Administered 2015-09-11: 10 mL/h via EPIDURAL

## 2015-09-11 MED ORDER — OXYCODONE-ACETAMINOPHEN 5-325 MG PO TABS: 2.0000 | ORAL_TABLET | ORAL | Status: DC | PRN

## 2015-09-11 MED ORDER — EPHEDRINE 5 MG/ML INJ
10.0000 mg | INTRAVENOUS | Status: DC | PRN
Start: 2015-09-11 — End: 2015-09-11

## 2015-09-11 MED ORDER — OXYCODONE-ACETAMINOPHEN 5-325 MG PO TABS: 1.0000 | ORAL_TABLET | ORAL | Status: DC | PRN

## 2015-09-11 MED ORDER — LACTATED RINGERS IV SOLN: 500.0000 mL | Freq: Once | INTRAVENOUS | Status: DC

## 2015-09-11 MED ORDER — PHENYLEPHRINE 40 MCG/ML (10ML) SYRINGE FOR IV PUSH (FOR BLOOD PRESSURE SUPPORT): 80.0000 ug | PREFILLED_SYRINGE | INTRAVENOUS | Status: DC | PRN

## 2015-09-11 MED ORDER — ACETAMINOPHEN 325 MG PO TABS: 650.0000 mg | ORAL_TABLET | ORAL | Status: DC | PRN

## 2015-09-11 MED ORDER — DOCUSATE SODIUM 100 MG PO CAPS
100.0000 mg | ORAL_CAPSULE | Freq: Two times a day (BID) | ORAL | Status: DC | PRN
  Administered 2015-09-12: 100 mg via ORAL
  Filled 2015-09-11: qty 1

## 2015-09-11 MED ORDER — DIPHENHYDRAMINE HCL 25 MG PO CAPS: 25.0000 mg | ORAL_CAPSULE | Freq: Four times a day (QID) | ORAL | Status: DC | PRN

## 2015-09-11 MED ORDER — ZOLPIDEM TARTRATE 5 MG PO TABS
5.0000 mg | ORAL_TABLET | Freq: Every evening | ORAL | Status: DC | PRN
Start: 2015-09-11 — End: 2015-09-13

## 2015-09-11 MED ORDER — FERROUS SULFATE 325 (65 FE) MG PO TABS
325.0000 mg | ORAL_TABLET | Freq: Every day | ORAL | Status: DC
  Administered 2015-09-12 – 2015-09-13 (×2): 325 mg via ORAL
  Filled 2015-09-11 (×3): qty 1

## 2015-09-11 MED ORDER — WITCH HAZEL-GLYCERIN EX PADS: 1.0000 "application " | MEDICATED_PAD | CUTANEOUS | Status: DC | PRN

## 2015-09-11 MED ORDER — BENZOCAINE-MENTHOL 20-0.5 % EX AERO: 1.0000 "application " | INHALATION_SPRAY | CUTANEOUS | Status: DC | PRN

## 2015-09-11 MED ORDER — EPHEDRINE 5 MG/ML INJ: 10.0000 mg | INTRAVENOUS | Status: DC | PRN

## 2015-09-11 MED ORDER — LIDOCAINE HCL (PF) 1 % IJ SOLN
INTRAMUSCULAR | Status: DC | PRN
  Administered 2015-09-11: 3 mL

## 2015-09-11 MED ORDER — LIDOCAINE-EPINEPHRINE (PF) 1.5 %-1:200000 IJ SOLN
INTRAMUSCULAR | Status: DC | PRN
  Administered 2015-09-11: 3 mL via EPIDURAL

## 2015-09-11 MED ORDER — PRENATAL MULTIVITAMIN CH
1.0000 | ORAL_TABLET | Freq: Every day | ORAL | Status: DC
  Administered 2015-09-11 – 2015-09-13 (×3): 1 via ORAL
  Filled 2015-09-11 (×4): qty 1

## 2015-09-11 MED ORDER — DIBUCAINE 1 % RE OINT: 1.0000 "application " | TOPICAL_OINTMENT | RECTAL | Status: DC | PRN

## 2015-09-11 MED ORDER — SIMETHICONE 80 MG PO CHEW: 80.0000 mg | CHEWABLE_TABLET | ORAL | Status: DC | PRN

## 2015-09-11 MED ORDER — DIPHENHYDRAMINE HCL 50 MG/ML IJ SOLN: 12.5000 mg | INTRAMUSCULAR | Status: DC | PRN

## 2015-09-11 MED ORDER — BUPIVACAINE HCL (PF) 0.25 % IJ SOLN
INTRAMUSCULAR | Status: DC | PRN
  Administered 2015-09-11 (×2): 5 mL via EPIDURAL

## 2015-09-11 MED ORDER — FENTANYL 2.5 MCG/ML W/ROPIVACAINE 0.2% IN NS 100 ML EPIDURAL INFUSION (ARMC-ANES): 9.0000 mL/h | EPIDURAL | Status: DC

## 2015-09-11 MED ORDER — LACTATED RINGERS IV SOLN: INTRAVENOUS | Status: DC

## 2015-09-11 MED ORDER — COCONUT OIL OIL
1.0000 "application " | TOPICAL_OIL | Status: DC | PRN
  Administered 2015-09-13: 1 via TOPICAL
  Filled 2015-09-11: qty 120

## 2015-09-11 MED ORDER — ONDANSETRON HCL 4 MG PO TABS: 4.0000 mg | ORAL_TABLET | ORAL | Status: DC | PRN

## 2015-09-11 MED ORDER — IBUPROFEN 600 MG PO TABS
600.0000 mg | ORAL_TABLET | Freq: Four times a day (QID) | ORAL | Status: DC
  Administered 2015-09-11 – 2015-09-13 (×8): 600 mg via ORAL
  Filled 2015-09-11 (×8): qty 1

## 2015-09-11 NOTE — Anesthesia Postprocedure Evaluation (Signed)
Anesthesia Post Note  Patient: Nichole Lang  Procedure(s) Performed: * No procedures listed *  Patient location during evaluation: PACU Anesthesia Type: General Level of consciousness: awake Pain management: pain level controlled Vital Signs Assessment: post-procedure vital signs reviewed and stable Respiratory status: spontaneous breathing Cardiovascular status: stable Anesthetic complications: no    Last Vitals:  Filed Vitals:   09/11/15 1226 09/11/15 1241  BP: 126/68 128/67  Pulse: 105 117  Temp:    Resp:      Last Pain:  Filed Vitals:   09/11/15 1308  PainSc: 8                  VAN STAVEREN,Ailish Prospero

## 2015-09-11 NOTE — Anesthesia Preprocedure Evaluation (Signed)
Anesthesia Evaluation  Patient identified by MRN, date of birth, ID band Patient awake    Reviewed: Allergy & Precautions, Patient's Chart, lab work & pertinent test results  Airway Mallampati: II  TM Distance: >3 FB     Dental   Pulmonary           Cardiovascular hypertension,      Neuro/Psych    GI/Hepatic GERD  ,  Endo/Other    Renal/GU      Musculoskeletal   Abdominal   Peds  Hematology   Anesthesia Other Findings   Reproductive/Obstetrics (+) Pregnancy                             Anesthesia Physical Anesthesia Plan  ASA: II  Anesthesia Plan: Epidural   Post-op Pain Management:    Induction:   Airway Management Planned:   Additional Equipment:   Intra-op Plan:   Post-operative Plan:   Informed Consent: I have reviewed the patients History and Physical, chart, labs and discussed the procedure including the risks, benefits and alternatives for the proposed anesthesia with the patient or authorized representative who has indicated his/her understanding and acceptance.   Dental advisory given  Plan Discussed with: CRNA and Anesthesiologist  Anesthesia Plan Comments:         Anesthesia Quick Evaluation

## 2015-09-11 NOTE — Anesthesia Procedure Notes (Signed)
Epidural Patient location during procedure: OB Start time: 09/11/2015 7:20 AM End time: 09/11/2015 7:50 AM  Staffing Anesthesiologist: Lezlie OctaveVAN STAVEREN, GIJSBERTUS F Resident/CRNA: Malva CoganBEANE, Tanisa Lagace Performed by: resident/CRNA   Preanesthetic Checklist Completed: patient identified, site marked, surgical consent, pre-op evaluation, timeout performed, IV checked, risks and benefits discussed and monitors and equipment checked  Epidural Patient position: sitting Prep: Betadine Patient monitoring: heart rate, continuous pulse ox and blood pressure Approach: midline Location: L3-L4 Injection technique: LOR saline  Needle:  Needle type: Tuohy  Needle gauge: 18 G Needle length: 9 cm and 9 Needle insertion depth: 8 cm Catheter type: closed end flexible Catheter size: 20 Guage Catheter at skin depth: 13 cm Test dose: negative and 1.5% lidocaine with Epi 1:200 K  Assessment Events: blood not aspirated, injection not painful, no injection resistance, negative IV test and no paresthesia  Additional Notes   Patient tolerated the insertion well without complications.Reason for block:procedure for pain

## 2015-09-11 NOTE — Progress Notes (Signed)
Pt requesting epidural. FHR tracing showing variable and prolonged decelerations. Confirmed with Dr. Bonney AidStaebler at the desk that it is ok to proceed with epidural and possibility of not tracing FHR during epidural procedure. Dr. Daiva EvesStaebler verbalizes that it fine to proceed and he will remain in department.

## 2015-09-11 NOTE — Progress Notes (Signed)
Subjective:  Comfortable with epidural, has made rapid cervical change in the last hour.    Objective:   Vitals: Blood pressure 141/76, pulse 111, temperature 98.1 F (36.7 C), temperature source Oral, resp. rate 18, height  (1.702 m), weight 129.729 kg (286 lb), SpO2 100 %. General: NAD Abdomen: gravid, non-tender Cervical Exam:  Dilation: Lip/rim Effacement (%): 90 Cervical Position: Anterior Station: 0, +1 Presentation: Vertex Exam by:: AMS  FHT: 120, moderate, +accles with scalp stim, has had several deceleration Toco: q55min  IUPC and FSE placed  Results for orders placed or performed during the hospital encounter of 09/10/15 (from the past 24 hour(s))  CBC     Status: Abnormal   Collection Time: 09/10/15  9:51 PM  Result Value Ref Range   WBC 20.8 (H) 3.6 - 11.0 K/uL   RBC 4.48 3.80 - 5.20 MIL/uL   Hemoglobin 11.6 (L) 12.0 - 16.0 g/dL   HCT 40.9 (L) 81.1 - 91.4 %   MCV 77.2 (L) 80.0 - 100.0 fL   MCH 26.0 26.0 - 34.0 pg   MCHC 33.6 32.0 - 36.0 g/dL   RDW 78.2 (H) 95.6 - 21.3 %   Platelets 237 150 - 440 K/uL  Type and screen [facility]     Status: None   Collection Time: 09/10/15  9:51 PM  Result Value Ref Range   ABO/RH(D) AB NEG    Antibody Screen NEG    Sample Expiration 09/13/2015   Comprehensive metabolic panel     Status: Abnormal   Collection Time: 09/10/15  9:51 PM  Result Value Ref Range   Sodium 134 (L) 135 - 145 mmol/L   Potassium 3.8 3.5 - 5.1 mmol/L   Chloride 104 101 - 111 mmol/L   CO2 22 22 - 32 mmol/L   Glucose, Bld 89 65 - 99 mg/dL   BUN 7 6 - 20 mg/dL   Creatinine, Ser 0.86 0.44 - 1.00 mg/dL   Calcium 9.5 8.9 - 57.8 mg/dL   Total Protein 7.2 6.5 - 8.1 g/dL   Albumin 2.9 (L) 3.5 - 5.0 g/dL   AST 23 15 - 41 U/L   ALT 14 14 - 54 U/L   Alkaline Phosphatase 105 38 - 126 U/L   Total Bilirubin 0.3 0.3 - 1.2 mg/dL   GFR calc non Af Amer >60 >60 mL/min   GFR calc Af Amer >60 >60 mL/min   Anion gap 8 5 - 15  Protein /  creatinine ratio, urine     Status: None   Collection Time: 09/10/15  9:51 PM  Result Value Ref Range   Creatinine, Urine 243 mg/dL   Total Protein, Urine 33 mg/dL   Protein Creatinine Ratio 0.14 0.00 - 0.15 mg/mg[Cre]  ABO/Rh     Status: None   Collection Time: 09/10/15  9:52 PM  Result Value Ref Range   ABO/RH(D) AB NEG   CBC     Status: Abnormal   Collection Time: 09/11/15  6:32 AM  Result Value Ref Range   WBC 18.4 (H) 3.6 - 11.0 K/uL   RBC 4.41 3.80 - 5.20 MIL/uL   Hemoglobin 11.5 (L) 12.0 - 16.0 g/dL   HCT 46.9 (L) 62.9 - 52.8 %   MCV 78.8 (L) 80.0 - 100.0 fL   MCH 26.2 26.0 - 34.0 pg   MCHC 33.2 32.0 - 36.0 g/dL   RDW 41.3 (H) 24.4 - 01.0 %   Platelets 204 150 - 440 K/uL  Assessment:   25 y.o. G1P0 10066w3d IOL obesity and CHTN  Plan:   1) Labor - rapid cervical change on cervidil overnight  2) Fetus - cat II tracing, good accelerations with fetal scalp stimulation.  Position changes and O2

## 2015-09-12 ENCOUNTER — Encounter: Payer: Self-pay | Admitting: Anesthesiology

## 2015-09-12 LAB — CBC
HCT: 33.4 % — ABNORMAL LOW (ref 35.0–47.0)
Hemoglobin: 11.1 g/dL — ABNORMAL LOW (ref 12.0–16.0)
MCH: 26.5 pg (ref 26.0–34.0)
MCHC: 33.1 g/dL (ref 32.0–36.0)
MCV: 80 fL (ref 80.0–100.0)
PLATELETS: 187 10*3/uL (ref 150–440)
RBC: 4.18 MIL/uL (ref 3.80–5.20)
RDW: 14.9 % — AB (ref 11.5–14.5)
WBC: 17.4 10*3/uL — AB (ref 3.6–11.0)

## 2015-09-12 LAB — FETAL SCREEN: FETAL SCREEN: NEGATIVE

## 2015-09-12 LAB — RPR: RPR Ser Ql: NONREACTIVE

## 2015-09-12 MED ORDER — RHO D IMMUNE GLOBULIN 1500 UNIT/2ML IJ SOSY
300.0000 ug | PREFILLED_SYRINGE | Freq: Once | INTRAMUSCULAR | Status: AC
  Administered 2015-09-12: 300 ug via INTRAMUSCULAR
  Filled 2015-09-12: qty 2

## 2015-09-12 NOTE — Lactation Note (Signed)
This note was copied from a baby's chart. Lactation Consultation Note  Patient Name: Nichole Lang ZOXWR'UToday's Date: 09/12/2015     Maternal Data  Mother is breast feerding well and breast feeding basics reviewed.  Feeding Feeding Type: Breast Fed Length of feed: 10 min  LATCH Score/Interventions                      Lactation Tools Discussed/Used     Consult Status      Trudee GripCarolyn P Emonnie Cannady 09/12/2015, 12:24 PM

## 2015-09-12 NOTE — Progress Notes (Signed)
Post Partum Day 1 Subjective: Doing well, no complaints.  Tolerating regular diet, pain with PO meds, voiding and ambulating without difficulty.  No CP SOB F/C N/V or leg pain No HA, change of vision, RUQ/epigastric pain  Objective: BP 130/80 mmHg  Pulse 88  Temp(Src) 98.1 F (36.7 C) (Oral)  Resp 20  Ht 5\' 7"  (1.702 m)  Wt 129.729 kg (286 lb)  BMI 44.78 kg/m2  SpO2 100%  LMP  (LMP Unknown)  Breastfeeding  Physical Exam:  General: NAD CV: RRR Pulm: nl effort, CTABL Lochia: moderate Uterine Fundus: fundus firm and below umbilicus DVT Evaluation: no cords, ttp LEs    Recent Labs  09/11/15 0632 09/12/15 0557  HGB 11.5* 11.1*  HCT 34.8* 33.4*  WBC 18.4* 17.4*  PLT 204 187   Maternal blood type: AB- Newborn blood type: B+  Assessment/Plan: 25 y.o. G1P1001 postpartum day # 1  1. Continue routine postpartum care 2. Breastfeeding/Nexplanon 3. Rhogam before DC to home 4. Rubella Immune/Varicella Immune/TDAP UTD 5. Disposition: DC to home tomorrow    Tresea MallGLEDHILL,Raequon Catanzaro, CNM

## 2015-09-12 NOTE — Anesthesia Postprocedure Evaluation (Signed)
Anesthesia Post Note  Patient: Carolynn Serveshley Dewan  Procedure(s) Performed: * No procedures listed *  Patient location during evaluation: Mother Baby Anesthesia Type: Epidural Level of consciousness: awake, awake and alert and oriented Vital Signs Assessment: post-procedure vital signs reviewed and stable Respiratory status: spontaneous breathing and nonlabored ventilation Cardiovascular status: blood pressure returned to baseline and stable Postop Assessment: no headache, epidural receding, no backache, no signs of nausea or vomiting and adequate PO intake    Last Vitals:  Filed Vitals:   09/12/15 0410 09/12/15 0738  BP: 105/57 130/80  Pulse: 100 88  Temp: 36.5 C 36.7 C  Resp: 18 20    Last Pain:  Filed Vitals:   09/12/15 0739  PainSc: 0-No pain                 Precilla Purnell,  Wynona LunaPierre A

## 2015-09-12 NOTE — Discharge Summary (Signed)
Obstetrical Discharge Summary  Date of Admission: 09/10/2015 Date of Discharge: 09/13/2015  Primary OB: [facility]  Gestational Age at Delivery: 2647w3d   Antepartum complications: Obesity & CHTN/GHTN Reason for Admission: IOL for GHTN/CHTN  Date of Delivery: 09/11/15  Delivered By: T. Brothers, CNM Delivery Type: spontaneous vaginal delivery Intrapartum complications/course: None Anesthesia: epidural Placenta: Delivered and expressed via active management. Intact: yes. To pathology: no.  Laceration: none Episiotomy: none Baby: Liveborn female, APGARs 8/9, weight 3180 g. August  Discharge Diagnosis: Delivered. 09/11/2015  Postpartum course: Postpartum course was benign with a few mild range blood pressures. On PPD # 2 patient was afebrile, had appropriate lochia, was eating a regular diet, and ambulating without difficulty. Cramping was tolerable on Motrin. She received Rhogam on PPD #1 as baby was B positive. She was discharged on PPD #2 and was advised to return to office in one week for a blood pressure check. Discharge Vital Signs:  Patient Vitals for the past 6 hrs:  BP Temp Temp src Pulse Resp SpO2  09/13/15 0730 137/90 mmHg 98.3 F (36.8 C) Oral 86 18 100 %    Discharge Exam:  NAD Lochia: appropriate Abdomen: firm fundus U-2/ML/NT Ext: no evidence of DVT  Disposition: Home  Rh Immune globulin given: yes Rubella vaccine given: not applicable Tdap vaccine given in AP or PP setting: yes  Contraception: Nexplanon  Prenatal/Postnatal Panel: AB NEG//Rubella Immune//Varicella Immune//RPR negative//HIV negative/HepB Surface Ag negative//   Plan:  Carolynn Serveshley Goll was discharged to home in good condition. Follow-up appointment with T. Brothers, CNM in 1 week for a BP check and in 6 weeks for a postpartum visit  Discharge Medications:   Medication List    TAKE these medications        ibuprofen 600 MG tablet  Commonly known as:  ADVIL,MOTRIN  Take 1 tablet (600 mg total) by  mouth every 6 (six) hours as needed for mild pain, moderate pain or cramping.     PROVIDA OB PO  Take 1 tablet by mouth daily.       Farrel ConnersGUTIERREZ, Sevanna Ballengee, CNM

## 2015-09-13 MED ORDER — FUSION PLUS PO CAPS: 1.0000 | ORAL_CAPSULE | Freq: Every day | ORAL | Status: DC

## 2015-09-13 MED ORDER — IBUPROFEN 600 MG PO TABS: 600.0000 mg | ORAL_TABLET | Freq: Four times a day (QID) | ORAL | Status: DC | PRN

## 2015-09-13 MED ORDER — OXYCODONE-ACETAMINOPHEN 5-325 MG PO TABS: 1.0000 | ORAL_TABLET | ORAL | Status: DC | PRN

## 2015-09-13 NOTE — Discharge Instructions (Signed)
Call your doctor for increased pain or vaginal bleeding, temperature above 100.4, depression, or concerns.  No strenuous activity or heavy lifting for 6 weeks.  No intercourse, tampons, douching, or enemas for 6 weeks.  No tub baths-showers only.  No driving for 2 weeks or while taking pain medications.  Continue prenatal vitamin and iron.  Increase calories and fluids while breastfeeding. ° °Vaginal Delivery, Care After °Refer to this sheet in the next few weeks. These discharge instructions provide you with information on caring for yourself after delivery. Your caregiver may also give you specific instructions. Your treatment has been planned according to the most current medical practices available, but problems sometimes occur. Call your caregiver if you have any problems or questions after you go home. °HOME CARE INSTRUCTIONS °1. Take over-the-counter or prescription medicines only as directed by your caregiver or pharmacist. °2. Do not drink alcohol, especially if you are breastfeeding or taking medicine to relieve pain. °3. Do not smoke tobacco. °4. Continue to use good perineal care. Good perineal care includes: °1. Wiping your perineum from back to front °2. Keeping your perineum clean. °3. You can do sitz baths twice a day, to help keep this area clean °5. Do not use tampons, douche or have sex until your caregiver says it is okay. °6. Shower only and avoid sitting in submerged water, aside from sitz baths °7. Wear a well-fitting bra that provides breast support. °8. Eat healthy foods. °9. Drink enough fluids to keep your urine clear or pale yellow. °10. Eat high-fiber foods such as whole grain cereals and breads, brown rice, beans, and fresh fruits and vegetables every day. These foods may help prevent or relieve constipation. °11. Avoid constipation with high fiber foods or medications, such as miralax or metamucil °12. Follow your caregiver's recommendations regarding resumption of activities such as  climbing stairs, driving, lifting, exercising, or traveling. °13. Talk to your caregiver about resuming sexual activities. Resumption of sexual activities is dependent upon your risk of infection, your rate of healing, and your comfort and desire to resume sexual activity. °14. Try to have someone help you with your household activities and your newborn for at least a few days after you leave the hospital. °15. Rest as much as possible. Try to rest or take a nap when your newborn is sleeping. °16. Increase your activities gradually. °17. Keep all of your scheduled postpartum appointments. It is very important to keep your scheduled follow-up appointments. At these appointments, your caregiver will be checking to make sure that you are healing physically and emotionally. °SEEK MEDICAL CARE IF:  °· You are passing large clots from your vagina. Save any clots to show your caregiver. °· You have a foul smelling discharge from your vagina. °· You have trouble urinating. °· You are urinating frequently. °· You have pain when you urinate. °· You have a change in your bowel movements. °· You have increasing redness, pain, or swelling near your vaginal incision (episiotomy) or vaginal tear. °· You have pus draining from your episiotomy or vaginal tear. °· Your episiotomy or vaginal tear is separating. °· You have painful, hard, or reddened breasts. °· You have a severe headache. °· You have blurred vision or see spots. °· You feel sad or depressed. °· You have thoughts of hurting yourself or your newborn. °· You have questions about your care, the care of your newborn, or medicines. °· You are dizzy or light-headed. °· You have a rash. °· You have nausea or   vomiting. °· You were breastfeeding and have not had a menstrual period within 12 weeks after you stopped breastfeeding. °· You are not breastfeeding and have not had a menstrual period by the 12th week after delivery. °· You have a fever. °SEEK IMMEDIATE MEDICAL CARE IF:   °· You have persistent pain. °· You have chest pain. °· You have shortness of breath. °· You faint. °· You have leg pain. °· You have stomach pain. °· Your vaginal bleeding saturates two or more sanitary pads in 1 hour. °MAKE SURE YOU:  °· Understand these instructions. °· Will watch your condition. °· Will get help right away if you are not doing well or get worse. °Document Released: 04/24/2000 Document Revised: 09/11/2013 Document Reviewed: 12/23/2011 °Exit[employer]® Patient Information ©2015 [employer]. This information is not intended to replace advice given to you by your health care provider. Make sure you discuss any questions you have with your health care provider. ° °Sitz Bath °A sitz bath is a warm water bath taken in the sitting position. The water covers only the hips and butt (buttocks). We recommend using one that fits in the toilet, to help with ease of use and cleanliness. It may be used for either healing or cleaning purposes. Sitz baths are also used to relieve pain, itching, or muscle tightening (spasms). The water may contain medicine. Moist heat will help you heal and relax.  °HOME CARE  °Take 3 to 4 sitz baths a day. °18. Fill the bathtub half-full with warm water. °19. Sit in the water and open the drain a little. °20. Turn on the warm water to keep the tub half-full. Keep the water running constantly. °21. Soak in the water for 15 to 20 minutes. °22. After the sitz bath, pat the affected area dry. °GET HELP RIGHT AWAY IF: °You get worse instead of better. Stop the sitz baths if you get worse. °MAKE SURE YOU: °· Understand these instructions. °· Will watch your condition. °· Will get help right away if you are not doing well or get worse. °Document Released: 06/04/2004 Document Revised: 01/20/2012 Document Reviewed: 08/25/2010 °ExitCare® Patient Information ©2015 [employer]. This information is not intended to replace advice given to you by your health care provider. Make sure you discuss  any questions you have with your health care provider. ° ° °

## 2015-09-13 NOTE — Progress Notes (Signed)
Prenatal records indicate that pt received TDaP vaccine on 07/22/15. Reynold BowenSusan Paisley Gabbrielle Mcnicholas, RN 09/13/2015 12:51 PM

## 2015-09-13 NOTE — Progress Notes (Signed)
Discharge instructions provided.  Pt and sig other verbalize understanding of all instructions and follow-up care.  Pt discharged to home with infant at 1325 on 09/13/15 via wheelchair by volunteer. Reynold BowenSusan Paisley Lexani Corona, RN 09/13/2015 2:31 PM

## 2015-09-14 LAB — RHOGAM INJECTION: UNIT DIVISION: 0

## 2016-09-29 IMAGING — US US EXTREM LOW VENOUS BILAT
1 series · 13 of 24 positions shown · non-contrast
Comparison: None.

CLINICAL DATA: Pregnant patient in third trimester pregnancy with
bilateral leg swelling.



[Series 1: us extrem low venous bilat · 0.07mm/px · 13 of 41 slices shown]
[im 1/41]
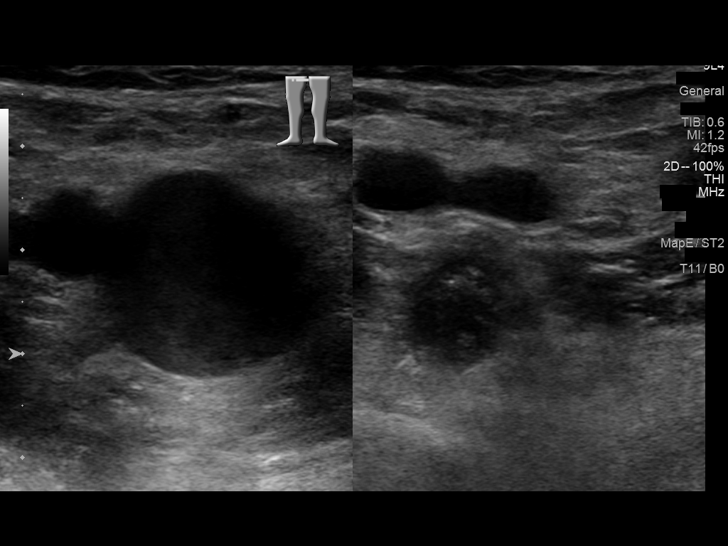
[im 4/41]
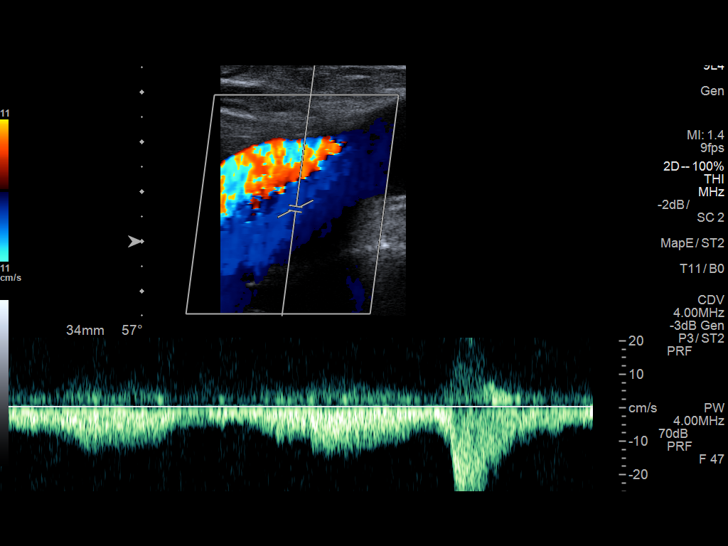
[im 7/41]
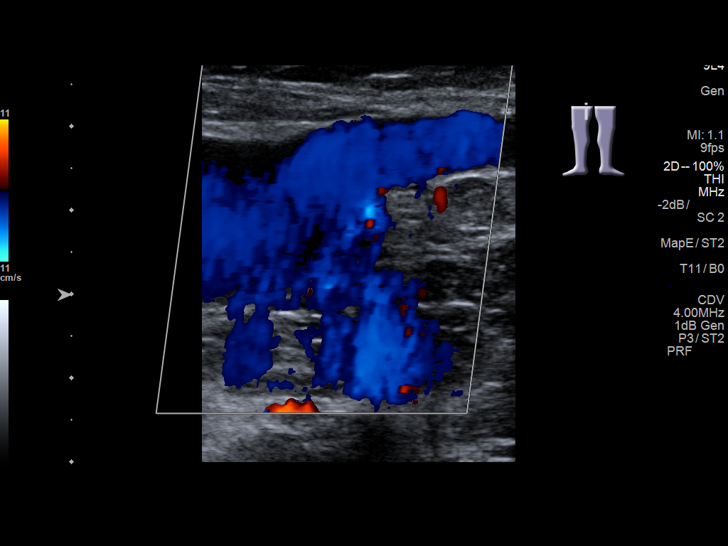
[im 11/41]
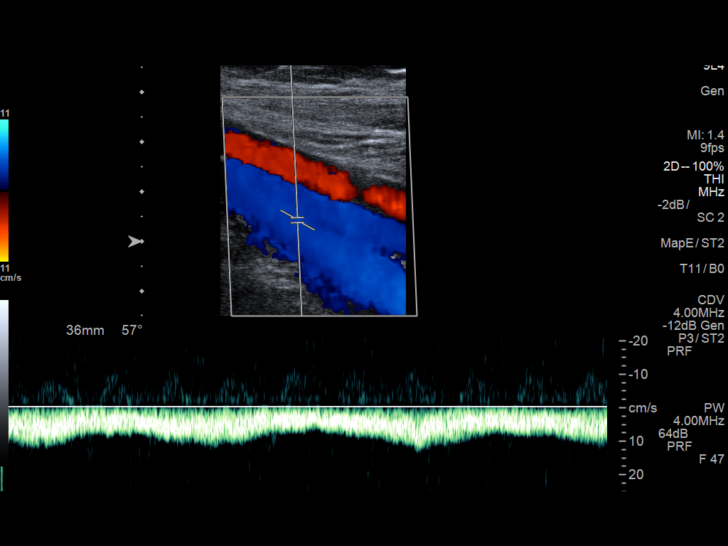
[im 14/41]
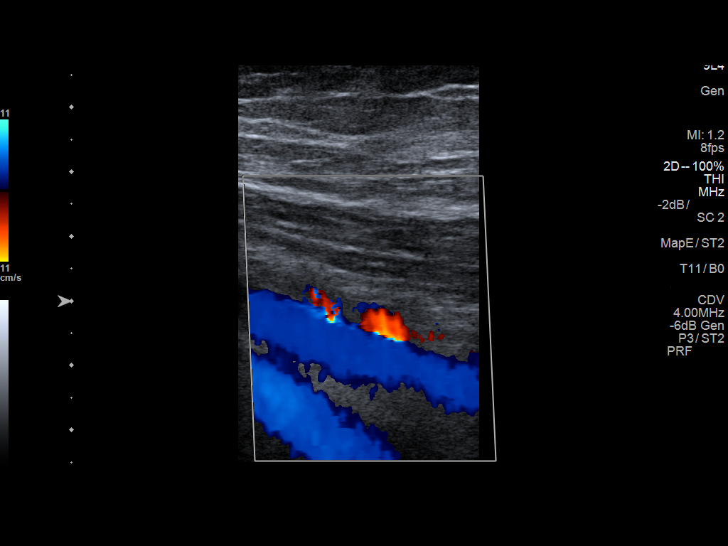
[im 18/41]
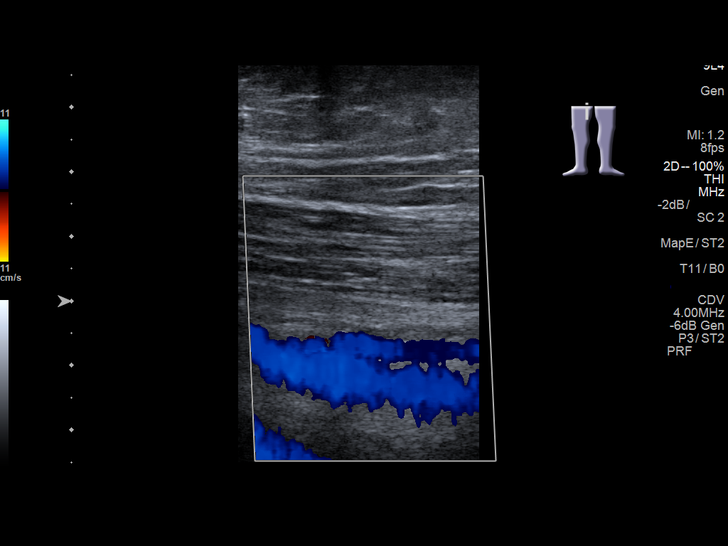
[im 21/41]
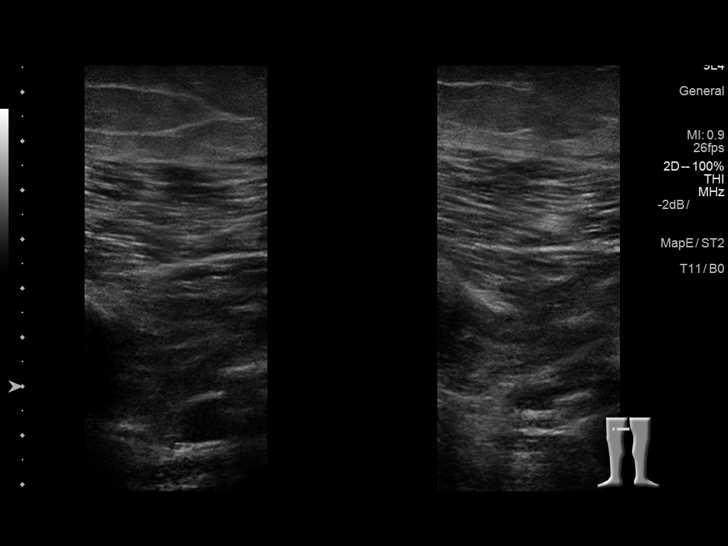
[im 23/41]
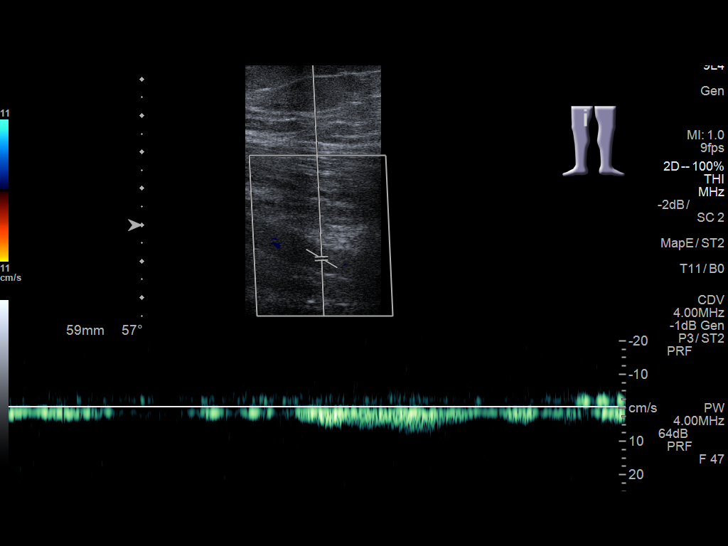
[im 27/41]
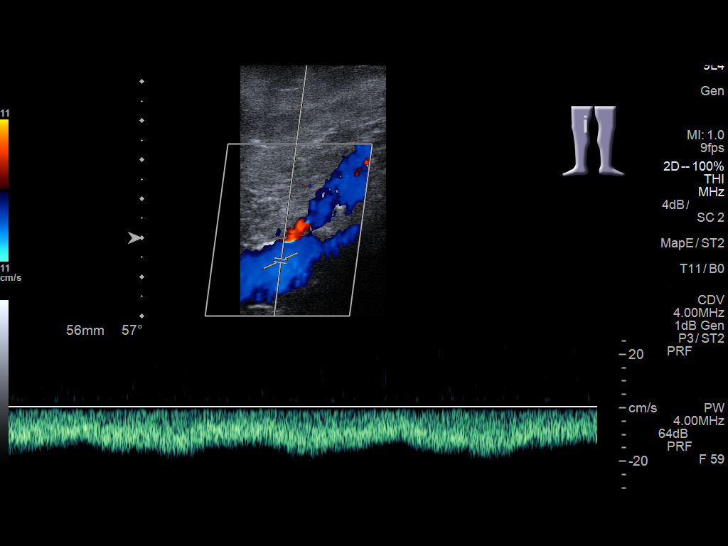
[im 30/41]
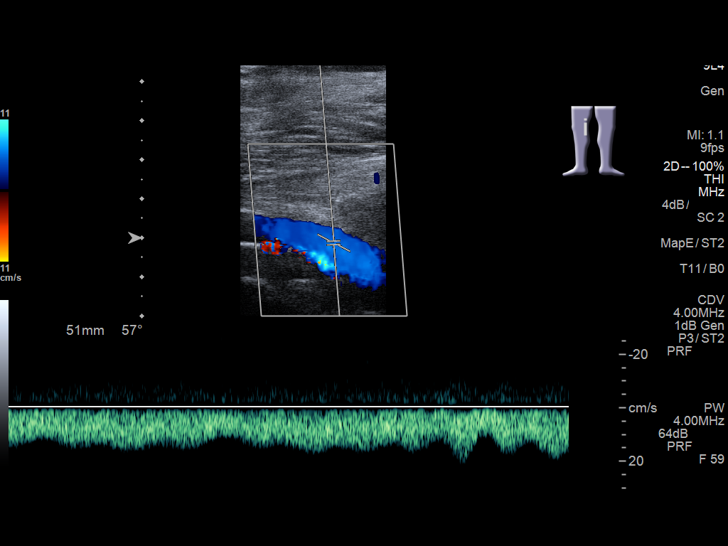
[im 34/41]
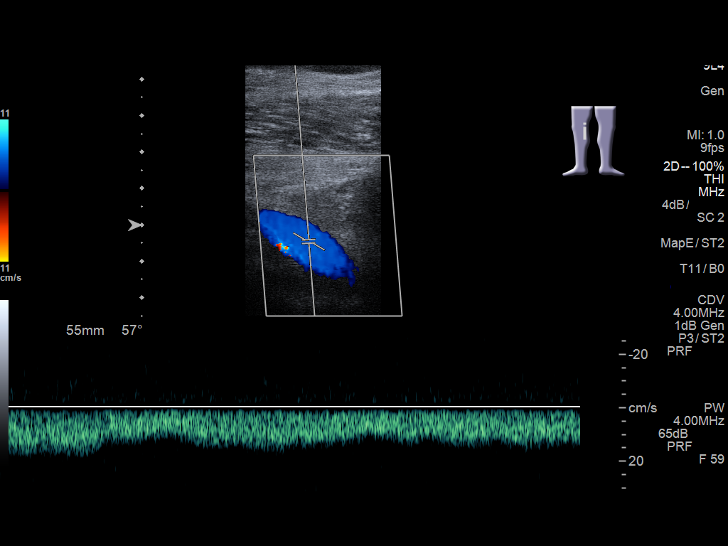
[im 37/41]
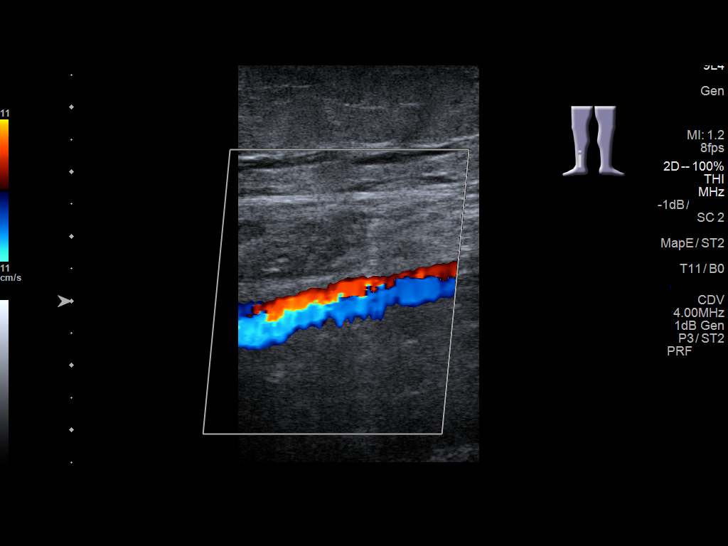
[im 41/41]
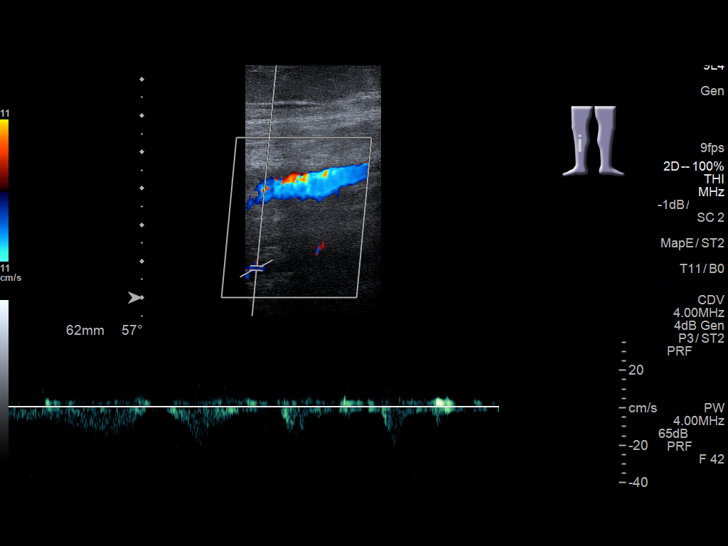

[13 of 24 positions shown; findings below may reference images not displayed]

FINDINGS: RIGHT LOWER EXTREMITY

Common Femoral Vein: No evidence of thrombus. Normal
compressibility, respiratory phasicity and response to augmentation.

Saphenofemoral Junction: No evidence of thrombus. Normal
compressibility and flow on color Doppler imaging.

Profunda Femoral Vein: No evidence of thrombus. Normal
compressibility and flow on color Doppler imaging.

Femoral Vein: No evidence of thrombus. Normal compressibility,
respiratory phasicity and response to augmentation.

Popliteal Vein: No evidence of thrombus. Normal compressibility,
respiratory phasicity and response to augmentation.

Calf Veins: No evidence of thrombus. Normal compressibility and flow
on color Doppler imaging.

Superficial Great Saphenous Vein: No evidence of thrombus. Normal
compressibility and flow on color Doppler imaging.

Venous Reflux:  None.

Other Findings:  None.

LEFT LOWER EXTREMITY

Common Femoral Vein: No evidence of thrombus. Normal
compressibility, respiratory phasicity and response to augmentation.

Saphenofemoral Junction: No evidence of thrombus. Normal
compressibility and flow on color Doppler imaging.

Profunda Femoral Vein: No evidence of thrombus. Normal
compressibility and flow on color Doppler imaging.

Femoral Vein: No evidence of thrombus. Normal compressibility,
respiratory phasicity and response to augmentation.

Popliteal Vein: No evidence of thrombus. Normal compressibility,
respiratory phasicity and response to augmentation.

Calf Veins: No evidence of thrombus. Normal compressibility and flow
on color Doppler imaging.

Superficial Great Saphenous Vein: No evidence of thrombus. Normal
compressibility and flow on color Doppler imaging.

Venous Reflux:  None.

Other Findings:  None.
IMPRESSION: No evidence of bilateral lower extremity deep venous thrombosis.

## 2018-10-17 DIAGNOSIS — Z6841 Body Mass Index (BMI) 40.0 and over, adult: Secondary | ICD-10-CM | POA: Diagnosis not present

## 2018-10-17 DIAGNOSIS — B029 Zoster without complications: Secondary | ICD-10-CM | POA: Diagnosis not present

## 2018-11-18 DIAGNOSIS — Z0000 Encounter for general adult medical examination without abnormal findings: Secondary | ICD-10-CM | POA: Diagnosis not present

## 2018-11-18 DIAGNOSIS — Z111 Encounter for screening for respiratory tuberculosis: Secondary | ICD-10-CM | POA: Diagnosis not present

## 2018-12-13 DIAGNOSIS — Z6841 Body Mass Index (BMI) 40.0 and over, adult: Secondary | ICD-10-CM | POA: Diagnosis not present

## 2018-12-13 DIAGNOSIS — J019 Acute sinusitis, unspecified: Secondary | ICD-10-CM | POA: Diagnosis not present

## 2018-12-13 DIAGNOSIS — J301 Allergic rhinitis due to pollen: Secondary | ICD-10-CM | POA: Diagnosis not present

## 2018-12-26 DIAGNOSIS — Z124 Encounter for screening for malignant neoplasm of cervix: Secondary | ICD-10-CM | POA: Diagnosis not present

## 2018-12-26 DIAGNOSIS — Z113 Encounter for screening for infections with a predominantly sexual mode of transmission: Secondary | ICD-10-CM | POA: Diagnosis not present

## 2018-12-26 DIAGNOSIS — Z0000 Encounter for general adult medical examination without abnormal findings: Secondary | ICD-10-CM | POA: Diagnosis not present

## 2018-12-26 DIAGNOSIS — Z6841 Body Mass Index (BMI) 40.0 and over, adult: Secondary | ICD-10-CM | POA: Diagnosis not present

## 2018-12-27 DIAGNOSIS — Z3046 Encounter for surveillance of implantable subdermal contraceptive: Secondary | ICD-10-CM | POA: Diagnosis not present

## 2019-01-17 DIAGNOSIS — J189 Pneumonia, unspecified organism: Secondary | ICD-10-CM | POA: Diagnosis not present

## 2019-01-17 DIAGNOSIS — Z20828 Contact with and (suspected) exposure to other viral communicable diseases: Secondary | ICD-10-CM | POA: Diagnosis not present

## 2019-01-17 DIAGNOSIS — R0602 Shortness of breath: Secondary | ICD-10-CM | POA: Diagnosis not present

## 2019-01-17 DIAGNOSIS — R0600 Dyspnea, unspecified: Secondary | ICD-10-CM | POA: Diagnosis not present

## 2019-01-17 DIAGNOSIS — R05 Cough: Secondary | ICD-10-CM | POA: Diagnosis not present

## 2019-01-17 DIAGNOSIS — R079 Chest pain, unspecified: Secondary | ICD-10-CM | POA: Diagnosis not present

## 2019-01-18 DIAGNOSIS — J189 Pneumonia, unspecified organism: Secondary | ICD-10-CM | POA: Diagnosis not present

## 2019-01-18 DIAGNOSIS — R0602 Shortness of breath: Secondary | ICD-10-CM | POA: Diagnosis not present

## 2019-01-18 DIAGNOSIS — J1289 Other viral pneumonia: Secondary | ICD-10-CM | POA: Diagnosis not present

## 2019-01-18 DIAGNOSIS — E876 Hypokalemia: Secondary | ICD-10-CM | POA: Diagnosis not present

## 2019-01-18 DIAGNOSIS — R0902 Hypoxemia: Secondary | ICD-10-CM | POA: Diagnosis not present

## 2019-01-18 DIAGNOSIS — B349 Viral infection, unspecified: Secondary | ICD-10-CM | POA: Diagnosis not present

## 2019-01-18 DIAGNOSIS — U071 COVID-19: Secondary | ICD-10-CM | POA: Diagnosis not present

## 2019-01-19 DIAGNOSIS — U071 COVID-19: Secondary | ICD-10-CM | POA: Diagnosis not present

## 2019-01-19 DIAGNOSIS — I10 Essential (primary) hypertension: Secondary | ICD-10-CM | POA: Diagnosis not present

## 2019-01-19 DIAGNOSIS — J1289 Other viral pneumonia: Secondary | ICD-10-CM | POA: Diagnosis not present

## 2019-01-19 DIAGNOSIS — E876 Hypokalemia: Secondary | ICD-10-CM | POA: Diagnosis not present

## 2019-01-20 DIAGNOSIS — J1289 Other viral pneumonia: Secondary | ICD-10-CM | POA: Diagnosis not present

## 2019-01-20 DIAGNOSIS — U071 COVID-19: Secondary | ICD-10-CM | POA: Diagnosis not present

## 2019-01-20 DIAGNOSIS — E876 Hypokalemia: Secondary | ICD-10-CM | POA: Diagnosis not present

## 2019-01-21 DIAGNOSIS — J1289 Other viral pneumonia: Secondary | ICD-10-CM | POA: Diagnosis not present

## 2019-01-21 DIAGNOSIS — U071 COVID-19: Secondary | ICD-10-CM | POA: Diagnosis not present

## 2019-01-21 DIAGNOSIS — E876 Hypokalemia: Secondary | ICD-10-CM | POA: Diagnosis not present

## 2019-01-22 DIAGNOSIS — R0602 Shortness of breath: Secondary | ICD-10-CM | POA: Diagnosis not present

## 2019-01-23 DIAGNOSIS — J1289 Other viral pneumonia: Secondary | ICD-10-CM | POA: Diagnosis not present

## 2019-01-23 DIAGNOSIS — R0902 Hypoxemia: Secondary | ICD-10-CM | POA: Diagnosis not present

## 2019-01-23 DIAGNOSIS — U071 COVID-19: Secondary | ICD-10-CM | POA: Diagnosis not present

## 2021-07-03 ENCOUNTER — Other Ambulatory Visit: Payer: Self-pay | Admitting: Obstetrics & Gynecology

## 2021-07-03 DIAGNOSIS — O3680X0 Pregnancy with inconclusive fetal viability, not applicable or unspecified: Secondary | ICD-10-CM

## 2021-07-04 ENCOUNTER — Other Ambulatory Visit: Payer: Self-pay

## 2021-07-04 ENCOUNTER — Ambulatory Visit (INDEPENDENT_AMBULATORY_CARE_PROVIDER_SITE_OTHER): Payer: 59

## 2021-07-04 DIAGNOSIS — O3680X0 Pregnancy with inconclusive fetal viability, not applicable or unspecified: Secondary | ICD-10-CM

## 2021-07-04 DIAGNOSIS — Z3A09 9 weeks gestation of pregnancy: Secondary | ICD-10-CM | POA: Diagnosis not present

## 2021-07-04 NOTE — Progress Notes (Signed)
[place] 9+2 wks,single IUP with YS,FHR 171 bpm,CRL 26.94 mm,normal ovaries

## 2021-07-24 DIAGNOSIS — O0990 Supervision of high risk pregnancy, unspecified, unspecified trimester: Secondary | ICD-10-CM | POA: Insufficient documentation

## 2021-07-28 ENCOUNTER — Ambulatory Visit: Payer: 59 | Admitting: *Deleted

## 2021-07-28 ENCOUNTER — Ambulatory Visit (INDEPENDENT_AMBULATORY_CARE_PROVIDER_SITE_OTHER): Payer: 59

## 2021-07-28 ENCOUNTER — Other Ambulatory Visit: Payer: Self-pay | Admitting: Obstetrics & Gynecology

## 2021-07-28 ENCOUNTER — Other Ambulatory Visit (HOSPITAL_COMMUNITY)
Admission: RE | Admit: 2021-07-28 | Discharge: 2021-07-28 | Disposition: A | Discharge status: Home / Self Care | Payer: 59 | Source: Ambulatory Visit | Attending: Advanced Practice Midwife | Admitting: Advanced Practice Midwife

## 2021-07-28 ENCOUNTER — Ambulatory Visit (INDEPENDENT_AMBULATORY_CARE_PROVIDER_SITE_OTHER): Payer: 59 | Admitting: Advanced Practice Midwife

## 2021-07-28 ENCOUNTER — Other Ambulatory Visit: Payer: Self-pay

## 2021-07-28 ENCOUNTER — Encounter: Payer: Self-pay | Admitting: Advanced Practice Midwife

## 2021-07-28 VITALS — BP 149/97 | HR 82 | Wt 300.0 lb

## 2021-07-28 DIAGNOSIS — Z6791 Unspecified blood type, Rh negative: Secondary | ICD-10-CM

## 2021-07-28 DIAGNOSIS — Z3480 Encounter for supervision of other normal pregnancy, unspecified trimester: Secondary | ICD-10-CM

## 2021-07-28 DIAGNOSIS — Z3682 Encounter for antenatal screening for nuchal translucency: Secondary | ICD-10-CM

## 2021-07-28 DIAGNOSIS — Z3A12 12 weeks gestation of pregnancy: Secondary | ICD-10-CM

## 2021-07-28 DIAGNOSIS — Z124 Encounter for screening for malignant neoplasm of cervix: Secondary | ICD-10-CM | POA: Diagnosis present

## 2021-07-28 DIAGNOSIS — O26899 Other specified pregnancy related conditions, unspecified trimester: Secondary | ICD-10-CM | POA: Diagnosis not present

## 2021-07-28 DIAGNOSIS — Z113 Encounter for screening for infections with a predominantly sexual mode of transmission: Secondary | ICD-10-CM | POA: Insufficient documentation

## 2021-07-28 DIAGNOSIS — Z6841 Body Mass Index (BMI) 40.0 and over, adult: Secondary | ICD-10-CM

## 2021-07-28 DIAGNOSIS — Z8759 Personal history of other complications of pregnancy, childbirth and the puerperium: Secondary | ICD-10-CM | POA: Insufficient documentation

## 2021-07-28 DIAGNOSIS — O99211 Obesity complicating pregnancy, first trimester: Secondary | ICD-10-CM

## 2021-07-28 DIAGNOSIS — I10 Essential (primary) hypertension: Secondary | ICD-10-CM

## 2021-07-28 DIAGNOSIS — D573 Sickle-cell trait: Secondary | ICD-10-CM

## 2021-07-28 LAB — POCT URINALYSIS DIPSTICK OB
Blood, UA: NEGATIVE
Glucose, UA: NEGATIVE
Ketones, UA: NEGATIVE
Leukocytes, UA: NEGATIVE
Nitrite, UA: NEGATIVE
POC,PROTEIN,UA: NEGATIVE

## 2021-07-28 MED ORDER — LABETALOL HCL 200 MG PO TABS: 200.0000 mg | ORAL_TABLET | Freq: Two times a day (BID) | ORAL | 3 refills | Status: AC

## 2021-07-28 MED ORDER — ASPIRIN EC 81 MG PO TBEC: 162.0000 mg | DELAYED_RELEASE_TABLET | Freq: Every day | ORAL | 6 refills | Status: AC

## 2021-07-28 NOTE — Progress Notes (Signed)
[place] 12+5 wks,measurements c/w dates,CRL 71.70 mm,FHR 152 bpm,NB present,NT 2.2 mm,normal ovaries ?

## 2021-07-28 NOTE — Patient Instructions (Signed)
Carolynn Serve, I greatly value your feedback.  If you receive a survey following your visit with [place] today, we appreciate you taking the time to fill it out.  ?Thanks, ?Cathie Beams, DNP, CNM ? ?[facility] HAS MOVED!!! ?It is now [facility] ?([address] [place], [place] [address]) ?Entrance located off of [address] ?Free 24/7 valet parking  ? Nausea & Vomiting ?Have saltine crackers or pretzels by your bed and eat a few bites before you raise your head out of bed in the morning ?Eat small frequent meals throughout the day instead of large meals ?Drink plenty of fluids throughout the day to stay hydrated, just don't drink a lot of fluids with your meals.  This can make your stomach fill up faster making you feel sick ?Do not brush your teeth right after you eat ?Products with real ginger are good for nausea, like ginger ale and ginger hard candy Make sure it says made with real ginger! ?Sucking on sour candy like lemon heads is also good for nausea ?If your prenatal vitamins make you nauseated, take them at night so you will sleep through the nausea ?Sea Bands ?If you feel like you need medicine for the nausea & vomiting please let us know ?If you are unable to keep any fluids or food down please let us know ? ? Constipation ?Drink plenty of fluid, preferably water, throughout the day ?Eat foods high in fiber such as fruits, vegetables, and grains ?Exercise, such as walking, is a good way to keep your bowels regular ?Drink warm fluids, especially warm prune juice, or decaf coffee ?Eat a 1/2 cup of real oatmeal (not instant), 1/2 cup applesauce, and 1/2-1 cup warm prune juice every day ?If needed, you may take Colace (docusate sodium) stool softener once or twice a day to help keep the stool soft.  ?If you still are having problems with constipation, you may take Miralax once daily as needed to help keep your bowels regular.  ? ?Home Blood Pressure Monitoring for  Patients  ? ?Your provider has recommended that you check your blood pressure (BP) at least once a week at home. If you do not have a blood pressure cuff at home, one will be provided for you. Contact your provider if you have not received your monitor within 1 week.  ? ?Helpful Tips for Accurate Home Blood Pressure Checks  ?Don't smoke, exercise, or drink caffeine 30 minutes before checking your BP ?Use the restroom before checking your BP (a full bladder can raise your pressure) ?Relax in a comfortable upright chair ?Feet on the ground ?Left arm resting comfortably on a flat surface at the level of your heart ?Legs uncrossed ?Back supported ?Sit quietly and don't talk ?Place the cuff on your bare arm ?Adjust snuggly, so that only two fingertips can fit between your skin and the top of the cuff ?Check 2 readings separated by at least one minute ?Keep a log of your BP readings ?For a visual, please reference this diagram: http://ccnc.care/bpdiagram ? ?Provider Name: [facility]     Phone: [phone] ? ?Zone 1: ALL CLEAR  ?Continue to monitor your symptoms:  ?BP reading is less than 140 (top number) or less than 90 (bottom number)  ?No right upper stomach pain ?No headaches or seeing spots ?No feeling nauseated or throwing up ?No swelling in face and hands ? ?Zone 2: CAUTION ?Call your doctor's office for any of the following:  ?BP  reading is greater than 140 (top number) or greater than 90 (bottom number)  ?Stomach pain under your ribs in the middle or right side ?Headaches or seeing spots ?Feeling nauseated or throwing up ?Swelling in face and hands ? ?Zone 3: EMERGENCY  ?Seek immediate medical care if you have any of the following:  ?BP reading is greater than160 (top number) or greater than 110 (bottom number) ?Severe headaches not improving with Tylenol ?Serious difficulty catching your breath ?Any worsening symptoms from Zone 2  ? ? First Trimester of Pregnancy ?The first trimester of pregnancy is from  week 1 until the end of week 12 (months 1 through 3). A week after a sperm fertilizes an egg, the egg will implant on the wall of the uterus. This embryo will begin to develop into a baby. Genes from you and your partner are forming the baby. The female genes determine whether the baby is a boy or a girl. At 6-8 weeks, the eyes and face are formed, and the heartbeat can be seen on ultrasound. At the end of 12 weeks, all the baby's organs are formed.  ?Now that you are pregnant, you will want to do everything you can to have a healthy baby. Two of the most important things are to get good prenatal care and to follow your health care provider's instructions. Prenatal care is all the medical care you receive before the baby's birth. This care will help prevent, find, and treat any problems during the pregnancy and childbirth. ?BODY CHANGES ?Your body goes through many changes during pregnancy. The changes vary from woman to woman.  ?You may gain or lose a couple of pounds at first. ?You may feel sick to your stomach (nauseous) and throw up (vomit). If the vomiting is uncontrollable, call your health care provider. ?You may tire easily. ?You may develop headaches that can be relieved by medicines approved by your health care provider. ?You may urinate more often. Painful urination may mean you have a bladder infection. ?You may develop heartburn as a result of your pregnancy. ?You may develop constipation because certain hormones are causing the muscles that push waste through your intestines to slow down. ?You may develop hemorrhoids or swollen, bulging veins (varicose veins). ?Your breasts may begin to grow larger and become tender. Your nipples may stick out more, and the tissue that surrounds them (areola) may become darker. ?Your gums may bleed and may be sensitive to brushing and flossing. ?Dark spots or blotches (chloasma, mask of pregnancy) may develop on your face. This will likely fade after the baby is  born. ?Your menstrual periods will stop. ?You may have a loss of appetite. ?You may develop cravings for certain kinds of food. ?You may have changes in your emotions from day to day, such as being excited to be pregnant or being concerned that something may go wrong with the pregnancy and baby. ?You may have more vivid and strange dreams. ?You may have changes in your hair. These can include thickening of your hair, rapid growth, and changes in texture. Some women also have hair loss during or after pregnancy, or hair that feels dry or thin. Your hair will most likely return to normal after your baby is born. ?WHAT TO EXPECT AT YOUR PRENATAL VISITS ?During a routine prenatal visit: ?You will be weighed to make sure you and the baby are growing normally. ?Your blood pressure will be taken. ?Your abdomen will be measured to track your baby's growth. ?The fetal heartbeat  will be listened to starting around week 10 or 12 of your pregnancy. ?Test results from any previous visits will be discussed. ?Your health care provider may ask you: ?How you are feeling. ?If you are feeling the baby move. ?If you have had any abnormal symptoms, such as leaking fluid, bleeding, severe headaches, or abdominal cramping. ?If you have any questions. ?Other tests that may be performed during your first trimester include: ?Blood tests to find your blood type and to check for the presence of any previous infections. They will also be used to check for low iron levels (anemia) and Rh antibodies. Later in the pregnancy, blood tests for diabetes will be done along with other tests if problems develop. ?Urine tests to check for infections, diabetes, or protein in the urine. ?An ultrasound to confirm the proper growth and development of the baby. ?An amniocentesis to check for possible genetic problems. ?Fetal screens for spina bifida and Down syndrome. ?You may need other tests to make sure you and the baby are doing well. ?HOME CARE  INSTRUCTIONS  ?Medicines ?Follow your health care provider's instructions regarding medicine use. Specific medicines may be either safe or unsafe to take during pregnancy. ?Take your prenatal vitamins as directed. ?If y

## 2021-07-28 NOTE — Progress Notes (Signed)
? ?INITIAL OBSTETRICAL VISIT ?Patient name: Nichole Lang MRN [id number]  Date of birth: Apr 19, 1991 ?Chief Complaint:   ?Initial Prenatal Visit ? ?History of Present Illness:   ?Nichole Lang is a 31 y.o. G107P1001 African American female at [redacted]w[redacted]d by LMP c/w u/s at 9 weeks with an Estimated Date of Delivery: 02/04/22 being seen today for her initial obstetrical visit.   ?Her obstetrical history is significant for GHTN, SCtrait (FOB neg).  Term SVD w/o problems ? ?Today she reports feeling better than she did first trimester. Marland Kitchen  ?Depression screen [facility] 2/9 07/28/2021  ?Decreased Interest 0  ?Down, Depressed, Hopeless 0  ?PHQ - 2 Score 0  ?Altered sleeping 0  ?Tired, decreased energy 1  ?Change in appetite 0  ?Feeling bad or failure about yourself  0  ?Trouble concentrating 0  ?Moving slowly or fidgety/restless 0  ?Suicidal thoughts 0  ?PHQ-9 Score 1  ? ? ?Patient's last menstrual period was 04/30/2021. ?Last pap 2017. Results were: unsure ?Review of Systems:   ?Pertinent items are noted in HPI ?Denies cramping/contractions, leakage of fluid, vaginal bleeding, abnormal vaginal discharge w/ itching/odor/irritation, headaches, visual changes, shortness of breath, chest pain, abdominal pain, severe nausea/vomiting, or problems with urination or bowel movements unless otherwise stated above.  ?Pertinent History Reviewed:  ?Reviewed past medical,surgical, social, obstetrical and family history.  ?Reviewed problem list, medications and allergies. ?OB History  ?Gravida Para Term Preterm AB Living  ?2 1 1     1   ?SAB IAB Ectopic Multiple Live Births  ?      0 1  ?  ?# Outcome Date GA Lbr Len/2nd Weight Sex Delivery Anes PTL Lv  ?2 Current           ?1 Term 09/11/15 [redacted]w[redacted]d / 00:16 7 lb 0.2 oz (3.18 kg) M Vag-Spont EPI N LIV  ?   Complications: Gestational hypertension  ? ?Physical Assessment:  ? ?Vitals:  ? 07/28/21 1400 07/28/21 1501  ?BP: (!) 151/96 (!) 149/97  ?Pulse: 82   ?Weight: 300 lb (136.1 kg)   ?Body mass index is  46.99 kg/m?. ? ?     Physical Examination: ? General appearance - well appearing, and in no distress ? Mental status - alert, oriented to person, place, and time ? Psych:  She has a normal mood and affect ? Skin - warm and dry, normal color, no suspicious lesions noted ? Chest - effort normal ? Heart - normal rate and regular rhythm ? Abdomen - soft, nontender ? Extremities:  No swelling or varicosities noted ? Pelvic - VULVA: normal appearing vulva with no masses, tenderness or lesions  VAGINA: normal appearing vagina with normal color and discharge, no lesions  CERVIX: normal appearing cervix without discharge or lesions, no CMT ? Thin prep pap is done with HR HPV cotesting ? ?TODAY'S NT [place] 12+5 wks,measurements c/w dates,CRL 71.70 mm,FHR 152 bpm,NB present,NT 2.2 mm,normal ovaries ? ?Results for orders placed or performed in visit on 07/28/21 (from the past 24 hour(s))  ?POC Urinalysis Dipstick OB  ? Collection Time: 07/28/21  3:01 PM  ?Result Value Ref Range  ? Color, UA    ? Clarity, UA    ? Glucose, UA Negative Negative  ? Bilirubin, UA    ? Ketones, UA neg   ? Spec Grav, UA    ? Blood, UA neg   ? pH, UA    ? POC,PROTEIN,UA Negative Negative, Trace, Small (1+), Moderate (2+), Large (3+), 4+  ? Urobilinogen, UA    ?  Nitrite, UA neg   ? Leukocytes, UA Negative Negative  ? Appearance    ? Odor    ?  ?Assessment & Plan:  ?1) High-Risk Pregnancy G2P1001 at [redacted]w[redacted]d with an Estimated Date of Delivery: 02/04/22  ? ?2) Initial OB visit ? ?3) CHTN--start labetalol 200mg  BID, ASA 162mg ;     Growth u/s q 4wks    2x/wk testing nst/sono @ 32wks     Deliver 38-39wks (37wks or prn if poor control)____ ? ? ?Meds:  ?Meds ordered this encounter  ?Medications  ? aspirin EC 81 MG tablet  ?  Sig: Take 2 tablets (162 mg total) by mouth daily.  ?  Dispense:  60 tablet  ?  Refill:  6  ?  Order Specific Question:   Supervising Provider  ?  Answer:   Tania Ade H [[id number]]  ? labetalol (NORMODYNE) 200 MG tablet  ?  Sig: Take 1 tablet (200  mg total) by mouth 2 (two) times daily.  ?  Dispense:  60 tablet  ?  Refill:  3  ?  Order Specific Question:   Supervising Provider  ?  Answer:   Tania Ade H [[id number]]  ? ? ?Initial labs obtained ?Continue prenatal vitamins ?Reviewed n/v relief measures and warning s/s to report ?Reviewed recommended weight gain based on pre-gravid BMI ?Encouraged well-balanced diet ?Genetic & carrier screening discussed: requests Panorama, NT/IT, and Horizon , declines AFP ?Ultrasound discussed; fetal survey: requested ?CCNC completed> form faxed if has or is planning to apply for medicaid ?The nature of [place] for [facility] with multiple MDs and other Advanced Practice Providers was explained to patient; also emphasized that fellows, residents, and students are part of our team. ?Has home bp cuff. Check bp weekly, let us know if >140/90.       ? ?Joaquim Lai Cresenzo-Dishmon ?3:57 PM ? ?

## 2021-07-30 LAB — CBC/D/PLT+RPR+RH+ABO+RUBIGG...
Antibody Screen: NEGATIVE
Basophils Absolute: 0.1 10*3/uL (ref 0.0–0.2)
Basos: 0 %
EOS (ABSOLUTE): 0.2 10*3/uL (ref 0.0–0.4)
Eos: 1 %
HCV Ab: NONREACTIVE
HIV Screen 4th Generation wRfx: NONREACTIVE
Hematocrit: 40.8 % (ref 34.0–46.6)
Hemoglobin: 13.5 g/dL (ref 11.1–15.9)
Hepatitis B Surface Ag: NEGATIVE
Immature Grans (Abs): 0 10*3/uL (ref 0.0–0.1)
Immature Granulocytes: 0 %
Lymphocytes Absolute: 2.6 10*3/uL (ref 0.7–3.1)
Lymphs: 16 %
MCH: 25.5 pg — ABNORMAL LOW (ref 26.6–33.0)
MCHC: 33.1 g/dL (ref 31.5–35.7)
MCV: 77 fL — ABNORMAL LOW (ref 79–97)
Monocytes Absolute: 0.7 10*3/uL (ref 0.1–0.9)
Monocytes: 4 %
Neutrophils Absolute: 12.7 10*3/uL — ABNORMAL HIGH (ref 1.4–7.0)
Neutrophils: 79 %
Platelets: 273 10*3/uL (ref 150–450)
RBC: 5.3 x10E6/uL — ABNORMAL HIGH (ref 3.77–5.28)
RDW: 14.3 % (ref 11.7–15.4)
RPR Ser Ql: NONREACTIVE
Rh Factor: NEGATIVE
Rubella Antibodies, IGG: 2.86 index (ref >0.99)
WBC: 16.3 10*3/uL — ABNORMAL HIGH (ref 3.4–10.8)

## 2021-07-30 LAB — COMPREHENSIVE METABOLIC PANEL
ALT: 17 IU/L (ref 0–32)
AST: 17 IU/L (ref 0–40)
Albumin/Globulin Ratio: 1.3 (ref 1.2–2.2)
Albumin: 4 g/dL (ref 3.8–4.8)
Alkaline Phosphatase: 64 IU/L (ref 44–121)
BUN/Creatinine Ratio: 10 (ref 9–23)
BUN: 7 mg/dL (ref 6–20)
Bilirubin Total: 0.2 mg/dL (ref 0.0–1.2)
CO2: 20 mmol/L (ref 20–29)
Calcium: 9.8 mg/dL (ref 8.7–10.2)
Chloride: 98 mmol/L (ref 96–106)
Creatinine, Ser: 0.73 mg/dL (ref 0.57–1.00)
Globulin, Total: 3 g/dL (ref 1.5–4.5)
Glucose: 76 mg/dL (ref 70–99)
Potassium: 4.2 mmol/L (ref 3.5–5.2)
Sodium: 135 mmol/L (ref 134–144)
Total Protein: 7 g/dL (ref 6.0–8.5)
eGFR: 111 mL/min/{1.73_m2} (ref >59)

## 2021-07-30 LAB — CYTOLOGY - PAP
Chlamydia: NEGATIVE
Comment: NEGATIVE
Comment: NEGATIVE
Comment: NORMAL
Diagnosis: NEGATIVE
High risk HPV: NEGATIVE
Neisseria Gonorrhea: NEGATIVE

## 2021-07-30 LAB — INTEGRATED 1
Crown Rump Length: 71.7 mm
Gest. Age on Collection Date: 13.1 weeks
Maternal Age at EDD: 33.7 yr
Nuchal Translucency (NT): 2.2 mm
Number of Fetuses: 1
PAPP-A Value: 1161.7 ng/mL
Weight: 300 [lb_av]

## 2021-07-30 LAB — PROTEIN / CREATININE RATIO, URINE
Creatinine, Urine: 205.7 mg/dL
Protein, Ur: 11.8 mg/dL
Protein/Creat Ratio: 57 mg/g creat (ref 0–200)

## 2021-07-30 LAB — HCV INTERPRETATION

## 2021-07-30 LAB — HEMOGLOBIN A1C
Est. average glucose Bld gHb Est-mCnc: 123 mg/dL
Hgb A1c MFr Bld: 5.9 % — ABNORMAL HIGH (ref 4.8–5.6)

## 2021-07-30 LAB — URINE CULTURE: Organism ID, Bacteria: NO GROWTH

## 2021-07-31 ENCOUNTER — Encounter: Payer: Self-pay | Admitting: Advanced Practice Midwife

## 2021-07-31 DIAGNOSIS — R7303 Prediabetes: Secondary | ICD-10-CM | POA: Insufficient documentation

## 2021-08-12 ENCOUNTER — Encounter: Payer: Self-pay | Admitting: Advanced Practice Midwife

## 2021-08-25 ENCOUNTER — Ambulatory Visit (INDEPENDENT_AMBULATORY_CARE_PROVIDER_SITE_OTHER): Payer: 59 | Admitting: Women's Health

## 2021-08-25 ENCOUNTER — Encounter: Payer: Self-pay | Admitting: Women's Health

## 2021-08-25 VITALS — BP 125/83 | HR 92 | Wt 298.0 lb

## 2021-08-25 DIAGNOSIS — Z363 Encounter for antenatal screening for malformations: Secondary | ICD-10-CM

## 2021-08-25 DIAGNOSIS — Z1379 Encounter for other screening for genetic and chromosomal anomalies: Secondary | ICD-10-CM

## 2021-08-25 DIAGNOSIS — O0992 Supervision of high risk pregnancy, unspecified, second trimester: Secondary | ICD-10-CM

## 2021-08-25 DIAGNOSIS — O10919 Unspecified pre-existing hypertension complicating pregnancy, unspecified trimester: Secondary | ICD-10-CM

## 2021-08-25 DIAGNOSIS — Z3A16 16 weeks gestation of pregnancy: Secondary | ICD-10-CM

## 2021-08-25 LAB — POCT URINALYSIS DIPSTICK OB
Blood, UA: NEGATIVE
Glucose, UA: NEGATIVE
Ketones, UA: NEGATIVE
Leukocytes, UA: NEGATIVE
Nitrite, UA: NEGATIVE

## 2021-08-25 NOTE — Progress Notes (Signed)
? ?HIGH-RISK PREGNANCY VISIT ?Patient name: Nichole Lang MRN [id number]  Date of birth: 14-Sep-1990 ?Chief Complaint:   ?Routine Prenatal Visit ? ?History of Present Illness:   ?Nichole Lang is a 31 y.o. G2P1001 female at [redacted]w[redacted]d with an Estimated Date of Delivery: 02/04/22 being seen today for ongoing management of a high-risk pregnancy complicated by chronic hypertension currently on labetalol 200mg  BID.   ? ?Today she reports no complaints. Contractions: Not present. Vag. Bleeding: None.  Movement: Absent. denies leaking of fluid.  ? ? ?  07/28/2021  ?  2:40 PM  ?Depression screen PHQ 2/9  ?Decreased Interest 0  ?Down, Depressed, Hopeless 0  ?PHQ - 2 Score 0  ?Altered sleeping 0  ?Tired, decreased energy 1  ?Change in appetite 0  ?Feeling bad or failure about yourself  0  ?Trouble concentrating 0  ?Moving slowly or fidgety/restless 0  ?Suicidal thoughts 0  ?PHQ-9 Score 1  ? ?  ? ?  07/28/2021  ?  2:40 PM  ?GAD 7 : Generalized Anxiety Score  ?Nervous, Anxious, on Edge 0  ?Control/stop worrying 0  ?Worry too much - different things 0  ?Trouble relaxing 0  ?Restless 0  ?Easily annoyed or irritable 0  ?Afraid - awful might happen 0  ?Total GAD 7 Score 0  ? ? ? ?Review of Systems:   ?Pertinent items are noted in HPI ?Denies abnormal vaginal discharge w/ itching/odor/irritation, headaches, visual changes, shortness of breath, chest pain, abdominal pain, severe nausea/vomiting, or problems with urination or bowel movements unless otherwise stated above. ?Pertinent History Reviewed:  ?Reviewed past medical,surgical, social, obstetrical and family history.  ?Reviewed problem list, medications and allergies. ?Physical Assessment:  ? ?Vitals:  ? 08/25/21 0935  ?BP: 125/83  ?Pulse: 92  ?Weight: 298 lb (135.2 kg)  ?Body mass index is 46.67 kg/m?. ?     ?     Physical Examination:  ? General appearance: alert, well appearing, and in no distress ? Mental status: alert, oriented to person, place, and time ? Skin: warm & dry   ? Extremities: Edema: None  ?  Cardiovascular: normal heart rate noted ? Respiratory: normal respiratory effort, no distress ? Abdomen: gravid, soft, non-tender ? Pelvic: Cervical exam deferred        ? ?Fetal Status: Fetal Heart Rate (bpm): 152   Movement: Absent   ? ?Fetal Surveillance Testing today: doppler  ? ?Chaperone: N/A   ? ?Results for orders placed or performed in visit on 08/25/21 (from the past 24 hour(s))  ?POC Urinalysis Dipstick OB  ? Collection Time: 08/25/21  9:37 AM  ?Result Value Ref Range  ? Color, UA    ? Clarity, UA    ? Glucose, UA Negative Negative  ? Bilirubin, UA    ? Ketones, UA neg   ? Spec Grav, UA    ? Blood, UA neg   ? pH, UA    ? POC,PROTEIN,UA Trace Negative, Trace, Small (1+), Moderate (2+), Large (3+), 4+  ? Urobilinogen, UA    ? Nitrite, UA neg   ? Leukocytes, UA Negative Negative  ? Appearance    ? Odor    ?  ?Assessment & Plan:  ?High-risk pregnancy: G2P1001 at [redacted]w[redacted]d with an Estimated Date of Delivery: 02/04/22  ? ?1) CHTN, stable on labetalol 200mg  BID, ASA ? ?2) Pre-diabetes range A1C, 5.9 (on 3/20), will get early GTT ? ?3) SCT+ ? ?Meds: No orders of the defined types were placed in this encounter. ? ?Labs/procedures today: 2nd  IT ? ?Treatment Plan:  Growth u/s q 4wks    2x/wk testing nst/sono @ 32wks     Deliver 38-39wks (37wks or prn if poor control)____  ? ?Reviewed: Preterm labor symptoms and general obstetric precautions including but not limited to vaginal bleeding, contractions, leaking of fluid and fetal movement were reviewed in detail with the patient.  All questions were answered. Does have home bp cuff. Office bp cuff given: not applicable. Check bp weekly, let us know if consistently >150 and/or >95. ? ?Follow-up: Return in about 3 weeks (around 09/15/2021) for HROB, [id number], MD or CNM, in person; asap 2hr GTT (no visit). ? ? ?Future Appointments  ?Date Time Provider [place]  ?09/15/2021 10:45 AM [facility] [place] [facility] None  ?09/15/2021 11:50 AM  Roma Schanz, CNM [facility]  ? ? ?Orders Placed This Encounter  ?Procedures  ? US OB Comp + 14 Wk  ? INTEGRATED 2  ? POC Urinalysis Dipstick OB  ? ?[place], [facility] ?08/25/2021 ?9:56 AM ? ?

## 2021-08-25 NOTE — Patient Instructions (Signed)
Morrie Sheldon, thank you for choosing our office today! We appreciate the opportunity to meet your healthcare needs. You may receive a short survey by mail, e-mail, or through [employer]. If you are happy with your care we would appreciate if you could take just a few minutes to complete the survey questions. We read all of your comments and take your feedback very seriously. Thank you again for choosing our office.  ?Center for [employer] Team at [facility] ?[facility] at [facility] ?([address] [place], [place] [address]) ?Entrance C, located off of [address] ?Free 24/7 valet parking  ?Go to [email or web address] to register for FREE online childbirth classes ? ?Call the office [phone]) or go to [facility] if: ?You begin to severe cramping ?Your water breaks.  Sometimes it is a big gush of fluid, sometimes it is just a trickle that keeps getting your panties wet or running down your legs ?You have vaginal bleeding.  It is normal to have a small amount of spotting if your cervix was checked.  ? ?[place] Pediatricians/Family Doctors ?[facility]): [address], [phone]           ?[facility]: [address], [phone]                ?[facility]): [address], [phone] (call to ask if accepting patients) ?[facility] Department: [address], [place], [phone]   ? ?[place] Pediatricians/Family Doctors ?[facility]): [address], [phone] ?[facility]: [address] [place], [phone] ?[facility]: [address], [phone] ? ?[employer] Family Doctors  ?[facility]): [phone] ?[facility]: [address], [phone]  ? ?[facility] Family Doctors ?[facility]: [address] [phone] ? ?[facility]  ?[facility]: [phone], [phone] ? ?Home Blood Pressure Monitoring for Patients  ? ?Your provider has recommended that you check your blood pressure (BP) at least once a week at home. If you do not have a blood pressure cuff at home, one will be provided for you. Contact your provider if you have not received your monitor within 1 week.  ? ?Helpful Tips for Accurate Home Blood Pressure Checks  ?Don't smoke, exercise, or drink caffeine 30 minutes before checking your BP ?Use the restroom before checking your BP (a full bladder can raise your pressure) ?Relax in a comfortable upright chair ?Feet on the ground ?Left arm resting comfortably on a flat surface at the level of your heart ?Legs uncrossed ?Back supported ?Sit quietly and don't talk ?Place the cuff on your bare arm ?Adjust snuggly, so that only two fingertips can fit between your skin and the top of the cuff ?Check 2 readings separated by at least one minute ?Keep a log of your BP readings ?For a visual, please reference this diagram: http://ccnc.care/bpdiagram ? ?Provider Name: [facility]     Phone: [phone] ? ?Zone 1: ALL CLEAR  ?Continue to monitor your symptoms:  ?BP reading is less than 140 (top number) or less than 90 (bottom number)  ?No right upper stomach pain ?No headaches or seeing spots ?No feeling nauseated or throwing up ?No swelling in face and hands ? ?Zone 2: CAUTION ?Call your doctor's office for any of the following:  ?BP reading is greater than 140 (top number) or greater than  90 (bottom number)  ?Stomach pain under your ribs in the middle or right side ?Headaches or seeing spots ?Feeling nauseated or throwing up ?Swelling in face and hands ? ?Zone 3: EMERGENCY  ?Seek immediate medical care if you have any of the following:  ?BP reading is greater than160 (top number) or greater than 110 (bottom number) ?Severe headaches not improving with Tylenol ?Serious difficulty catching your breath ?Any worsening symptoms from  Zone 2  ? ?  ?Second Trimester of Pregnancy ?The second trimester is from week 14 through week 27 (months 4 through 6). The second trimester is often a time when you feel your best. Your body has adjusted to being pregnant, and you begin to feel better physically. Usually, morning sickness has lessened or quit completely, you may have more energy, and you may have an increase in appetite. The second trimester is also a time when the fetus is growing rapidly. At the end of the sixth month, the fetus is about 9 inches long and weighs about 1? pounds. You will likely begin to feel the baby move (quickening) between 16 and 20 weeks of pregnancy. ?Body changes during your second trimester ?Your body continues to go through many changes during your second trimester. The changes vary from woman to woman. ?Your weight will continue to increase. You will notice your lower abdomen bulging out. ?You may begin to get stretch marks on your hips, abdomen, and breasts. ?You may develop headaches that can be relieved by medicines. The medicines should be approved by your health care provider. ?You may urinate more often because the fetus is pressing on your bladder. ?You may develop or continue to have heartburn as a result of your pregnancy. ?You may develop constipation because certain hormones are causing the muscles that push waste through your intestines to slow down. ?You may develop hemorrhoids or swollen, bulging veins (varicose veins). ?You may have back pain. This is caused by: ?Weight gain. ?Pregnancy hormones that are relaxing the joints in your pelvis. ?A shift in weight and the muscles that support your balance. ?Your breasts will continue to grow and they will continue to become tender. ?Your gums may bleed and may be sensitive to brushing and flossing. ?Dark spots or blotches (chloasma, mask of pregnancy) may develop on your face. This will likely fade after the baby is born. ?A dark line from your belly button to  the pubic area (linea nigra) may appear. This will likely fade after the baby is born. ?You may have changes in your hair. These can include thickening of your hair, rapid growth, and changes in texture. Some women also have hair loss during or after pregnancy, or hair that feels dry or thin. Your hair will most likely return to normal after your baby is born. ? ?What to expect at prenatal visits ?During a routine prenatal visit: ?You will be weighed to make sure you and the fetus are growing normally. ?Your blood pressure will be taken. ?Your abdomen will be measured to track your baby's growth. ?The fetal heartbeat will be listened to. ?Any test results from the previous visit will be discussed. ? ?Your health care provider may ask you: ?How you are feeling. ?If you are feeling the baby move. ?If you have had any abnormal symptoms, such as leaking fluid, bleeding, severe headaches, or abdominal cramping. ?If you are using any tobacco products, including cigarettes, chewing tobacco, and electronic cigarettes. ?If you have any questions. ? ?Other tests that may be performed during  your second trimester include: ?Blood tests that check for: ?Low iron levels (anemia). ?High blood sugar that affects pregnant women (gestational diabetes) between 59 and 28 weeks. ?Rh antibodies. This is to check for a protein on red blood cells (Rh factor). ?Urine tests to check for infections, diabetes, or protein in the urine. ?An ultrasound to confirm the proper growth and development of the baby. ?An amniocentesis to check for possible genetic problems. ?Fetal screens for spina bifida and Down syndrome. ?HIV (human immunodeficiency virus) testing. Routine prenatal testing includes screening for HIV, unless you choose not to have this test. ? ?Follow these instructions at home: ?Medicines ?Follow your health care provider's instructions regarding medicine use. Specific medicines may be either safe or unsafe to take during  pregnancy. ?Take a prenatal vitamin that contains at least 600 micrograms (mcg) of folic acid. ?If you develop constipation, try taking a stool softener if your health care provider approves. ?Eating and drinking ?Eat

## 2021-08-27 ENCOUNTER — Other Ambulatory Visit: Payer: 59

## 2021-08-27 LAB — INTEGRATED 2
AFP MoM: 1.01
Alpha-Fetoprotein: 21.9 ng/mL
Crown Rump Length: 71.7 mm
DIA MoM: 1.45
DIA Value: 150.6 pg/mL
Estriol, Unconjugated: 1.07 ng/mL
Gest. Age on Collection Date: 13.1 weeks
Gestational Age: 17.1 weeks
Maternal Age at EDD: 33.7 yr
Nuchal Translucency (NT): 2.2 mm
Nuchal Translucency MoM: 1.34
Number of Fetuses: 1
PAPP-A MoM: 2.21
PAPP-A Value: 1161.7 ng/mL
Test Results:: NEGATIVE
Weight: 300 [lb_av]
Weight: 300 [lb_av]
hCG MoM: 1.5
hCG Value: 24.9 IU/mL
uE3 MoM: 1.16

## 2021-08-29 ENCOUNTER — Other Ambulatory Visit: Payer: 59

## 2021-08-29 DIAGNOSIS — Z131 Encounter for screening for diabetes mellitus: Secondary | ICD-10-CM

## 2021-08-29 DIAGNOSIS — R7309 Other abnormal glucose: Secondary | ICD-10-CM

## 2021-08-29 DIAGNOSIS — Z3A17 17 weeks gestation of pregnancy: Secondary | ICD-10-CM

## 2021-08-29 DIAGNOSIS — O0992 Supervision of high risk pregnancy, unspecified, second trimester: Secondary | ICD-10-CM

## 2021-08-30 LAB — GLUCOSE TOLERANCE, 2 HOURS W/ 1HR
Glucose, 1 hour: 129 mg/dL (ref 70–179)
Glucose, 2 hour: 109 mg/dL (ref 70–152)
Glucose, Fasting: 88 mg/dL (ref 70–91)

## 2021-09-15 ENCOUNTER — Encounter: Payer: Self-pay | Admitting: Obstetrics & Gynecology

## 2021-09-15 ENCOUNTER — Ambulatory Visit (INDEPENDENT_AMBULATORY_CARE_PROVIDER_SITE_OTHER): Payer: 59 | Admitting: Obstetrics & Gynecology

## 2021-09-15 ENCOUNTER — Ambulatory Visit (INDEPENDENT_AMBULATORY_CARE_PROVIDER_SITE_OTHER): Payer: 59

## 2021-09-15 VITALS — BP 126/80 | HR 83 | Wt 300.0 lb

## 2021-09-15 DIAGNOSIS — O0992 Supervision of high risk pregnancy, unspecified, second trimester: Secondary | ICD-10-CM

## 2021-09-15 DIAGNOSIS — Z3A19 19 weeks gestation of pregnancy: Secondary | ICD-10-CM

## 2021-09-15 DIAGNOSIS — Z363 Encounter for antenatal screening for malformations: Secondary | ICD-10-CM

## 2021-09-15 DIAGNOSIS — O10919 Unspecified pre-existing hypertension complicating pregnancy, unspecified trimester: Secondary | ICD-10-CM

## 2021-09-15 DIAGNOSIS — O0990 Supervision of high risk pregnancy, unspecified, unspecified trimester: Secondary | ICD-10-CM

## 2021-09-15 LAB — POCT URINALYSIS DIPSTICK OB
Blood, UA: NEGATIVE
Glucose, UA: NEGATIVE
Ketones, UA: NEGATIVE
Nitrite, UA: NEGATIVE
POC,PROTEIN,UA: NEGATIVE

## 2021-09-15 NOTE — Progress Notes (Signed)
[place] 19+6 wks,breech,anterior placenta gr 0,normal ovaries,RVEICF 1.9 mm,CX 4.4 cm,SVP of fluid 4.4 cm,FHR 148 bpm,EFW 313 g 41%,anatomy complete ?

## 2021-09-15 NOTE — Progress Notes (Signed)
? ? ?HIGH-RISK PREGNANCY VISIT ?Patient name: Nichole Lang MRN [id number]  Date of birth: March 04, 1991 ?Chief Complaint:   ?High Risk Gestation ([place] today) ? ?History of Present Illness:   ?Nichole Lang is a 31 y.o. G2P1001 female at [redacted]w[redacted]d with an Estimated Date of Delivery: 02/04/22 being seen today for ongoing management of a high-risk pregnancy complicated by [facility] on labetalol 200 BID.   ? ?Today she reports no complaints. Contractions: Not present. Vag. Bleeding: None.  Movement: Present. denies leaking of fluid.  ? ? ?  07/28/2021  ?  2:40 PM  ?Depression screen PHQ 2/9  ?Decreased Interest 0  ?Down, Depressed, Hopeless 0  ?PHQ - 2 Score 0  ?Altered sleeping 0  ?Tired, decreased energy 1  ?Change in appetite 0  ?Feeling bad or failure about yourself  0  ?Trouble concentrating 0  ?Moving slowly or fidgety/restless 0  ?Suicidal thoughts 0  ?PHQ-9 Score 1  ? ?  ? ?  07/28/2021  ?  2:40 PM  ?GAD 7 : Generalized Anxiety Score  ?Nervous, Anxious, on Edge 0  ?Control/stop worrying 0  ?Worry too much - different things 0  ?Trouble relaxing 0  ?Restless 0  ?Easily annoyed or irritable 0  ?Afraid - awful might happen 0  ?Total GAD 7 Score 0  ? ? ? ?Review of Systems:   ?Pertinent items are noted in HPI ?Denies abnormal vaginal discharge w/ itching/odor/irritation, headaches, visual changes, shortness of breath, chest pain, abdominal pain, severe nausea/vomiting, or problems with urination or bowel movements unless otherwise stated above. ?Pertinent History Reviewed:  ?Reviewed past medical,surgical, social, obstetrical and family history.  ?Reviewed problem list, medications and allergies. ?Physical Assessment:  ? ?Vitals:  ? 09/15/21 1144  ?BP: 126/80  ?Pulse: 83  ?Weight: 300 lb (136.1 kg)  ?Body mass index is 46.99 kg/m?. ?     ?     Physical Examination:  ? General appearance: alert, well appearing, and in no distress ? Mental status: alert, oriented to person, place, and time ? Skin: warm & dry  ? Extremities:  Edema: None  ?  Cardiovascular: normal heart rate noted ? Respiratory: normal respiratory effort, no distress ? Abdomen: gravid, soft, non-tender ? Pelvic: Cervical exam deferred        ? ?Fetal Status:     Movement: Present   ? ?Fetal Surveillance Testing today: sonogram  ? ?Chaperone: N/A   ? ?Results for orders placed or performed in visit on 09/15/21 (from the past 24 hour(s))  ?POC Urinalysis Dipstick OB  ? Collection Time: 09/15/21 11:41 AM  ?Result Value Ref Range  ? Color, UA    ? Clarity, UA    ? Glucose, UA Negative Negative  ? Bilirubin, UA    ? Ketones, UA neg   ? Spec Grav, UA    ? Blood, UA neg   ? pH, UA    ? POC,PROTEIN,UA Negative Negative, Trace, Small (1+), Moderate (2+), Large (3+), 4+  ? Urobilinogen, UA    ? Nitrite, UA neg   ? Leukocytes, UA Trace (A) Negative  ? Appearance    ? Odor    ?  ?Assessment & Plan:  ?High-risk pregnancy: G2P1001 at [redacted]w[redacted]d with an Estimated Date of Delivery: 02/04/22  ? ? ?  ICD-10-CM   ?1. Supervision of high risk pregnancy, antepartum  O09.90 POC Urinalysis Dipstick OB  ?  ?2. Chronic hypertension affecting pregnancy  O10.919   ? on labetalol 200 BID  ?  ?3. [redacted] weeks  gestation of pregnancy  Z3A.19 POC Urinalysis Dipstick OB  ?  ?  ? ?Meds: No orders of the defined types were placed in this encounter. ? ? ?Orders:  ?Orders Placed This Encounter  ?Procedures  ? POC Urinalysis Dipstick OB  ?  ? ?Labs/procedures today: U/S ? ?Treatment Plan:  routine for hypertension ? ? ? ?Follow-up: Return in about 4 weeks (around 10/13/2021) for [place]. ? ? ?No future appointments. ? ?Orders Placed This Encounter  ?Procedures  ? POC Urinalysis Dipstick OB  ? ?[place]  ?Attending Physician for the Center for [place] ?[facility] ?09/15/2021 ?11:58 AM ? ?

## 2021-10-13 ENCOUNTER — Ambulatory Visit (INDEPENDENT_AMBULATORY_CARE_PROVIDER_SITE_OTHER): Payer: 59 | Admitting: Women's Health

## 2021-10-13 ENCOUNTER — Encounter: Payer: 59 | Admitting: Women's Health

## 2021-10-13 ENCOUNTER — Encounter: Payer: Self-pay | Admitting: Women's Health

## 2021-10-13 VITALS — BP 133/87 | HR 88 | Wt 301.0 lb

## 2021-10-13 DIAGNOSIS — O0992 Supervision of high risk pregnancy, unspecified, second trimester: Secondary | ICD-10-CM

## 2021-10-13 DIAGNOSIS — O09892 Supervision of other high risk pregnancies, second trimester: Secondary | ICD-10-CM

## 2021-10-13 DIAGNOSIS — O10919 Unspecified pre-existing hypertension complicating pregnancy, unspecified trimester: Secondary | ICD-10-CM

## 2021-10-13 DIAGNOSIS — Z3A23 23 weeks gestation of pregnancy: Secondary | ICD-10-CM

## 2021-10-13 DIAGNOSIS — O0990 Supervision of high risk pregnancy, unspecified, unspecified trimester: Secondary | ICD-10-CM

## 2021-10-13 LAB — POCT URINALYSIS DIPSTICK OB
Blood, UA: NEGATIVE
Glucose, UA: NEGATIVE
Ketones, UA: NEGATIVE
Leukocytes, UA: NEGATIVE
Nitrite, UA: NEGATIVE
POC,PROTEIN,UA: NEGATIVE

## 2021-10-13 NOTE — Patient Instructions (Signed)
Kirsi, thank you for choosing our office today! We appreciate the opportunity to meet your healthcare needs. You may receive a short survey by mail, e-mail, or through MyChart. If you are happy with your care we would appreciate if you could take just a few minutes to complete the survey questions. We read all of your comments and take your feedback very seriously. Thank you again for choosing our office.  [facility] for [facility] Team at [facility]  [facility] [place] ([address] [place], [place] [address]) Entrance C, located off of [address] Free 24/7 valet parking   You will have your sugar test next visit.  Please do not eat or drink anything after midnight the night before you come, not even water.  You will be here for at least two hours.  Please make an appointment online for the bloodwork at [email or web address] for 8:00am (or as close to this as possible). Make sure you select the [address] service center.   CLASSES: Go to [email or web address] to register for classes (childbirth, breastfeeding, waterbirth, infant CPR, daddy bootcamp, etc.)  Call the office ([phone]) or go to [facility] if: You begin to have strong, frequent contractions Your water breaks.  Sometimes it is a big gush of fluid, sometimes it is just a trickle that keeps getting your panties wet or running down your legs You have vaginal bleeding.  It is normal to have a small amount of spotting if your cervix was checked.  You don't feel your baby moving like normal.  If you don't, get you something to eat and drink and lay down and focus on feeling your baby move.   If your baby is still not moving like normal, you should call the office or go to [facility].  Call the office ([phone]) or go to [facility] for these signs of pre-eclampsia: Severe headache that does not go away with Tylenol Visual changes- seeing spots, double, blurred vision Pain under your right breast or upper  abdomen that does not go away with Tums or heartburn medicine Nausea and/or vomiting Severe swelling in your hands, feet, and face    [place] Pediatricians/Family Doctors [facility] ([facility]): [address], [phone]           [facility]: [address], [phone]                 [facility] ([facility]): [address], [phone] (call to ask if accepting patients) [facility]: [address], [place], [phone]    [place] Pediatricians/Family Doctors [facility] ([facility]): [address], [phone] [facility]: [address], [phone] [facility]: [address], [phone]  [place] Family Doctors  [facility] ([facility]): [phone] [facility]: [address], [phone]   [place] Family Doctors [facility]: [address], [phone]  [place] Family Doctors  [facility]: [address], [phone]  Home Blood Pressure Monitoring for Patients   Your provider has recommended that you check your blood pressure (BP) at least once a week at home. If you do not have a blood pressure cuff at home, one will be provided for you. Contact your provider if you have not received your monitor within 1 week.   Helpful Tips for Accurate Home Blood Pressure Checks  Don't smoke, exercise, or drink   caffeine 30 minutes before checking your BP Use the restroom before checking your BP (a full bladder can raise your pressure) Relax in a comfortable upright chair Feet on the ground Left arm resting comfortably on a flat surface at the level of your heart Legs uncrossed Back supported Sit quietly and don't talk Place the cuff on your bare arm Adjust snuggly, so that only two fingertips can fit between your skin and the top of the cuff Check 2  readings separated by at least one minute Keep a log of your BP readings For a visual, please reference this diagram: http://ccnc.care/bpdiagram  Provider Name: [facility]     Phone: [phone]  Zone 1: ALL CLEAR  Continue to monitor your symptoms:  BP reading is less than 140 (top number) or less than 90 (bottom number)  No right upper stomach pain No headaches or seeing spots No feeling nauseated or throwing up No swelling in face and hands  Zone 2: CAUTION Call your doctor's office for any of the following:  BP reading is greater than 140 (top number) or greater than 90 (bottom number)  Stomach pain under your ribs in the middle or right side Headaches or seeing spots Feeling nauseated or throwing up Swelling in face and hands  Zone 3: EMERGENCY  Seek immediate medical care if you have any of the following:  BP reading is greater than160 (top number) or greater than 110 (bottom number) Severe headaches not improving with Tylenol Serious difficulty catching your breath Any worsening symptoms from Zone 2   Second Trimester of Pregnancy The second trimester is from week 13 through week 28, months 4 through 6. The second trimester is often a time when you feel your best. Your body has also adjusted to being pregnant, and you begin to feel better physically. Usually, morning sickness has lessened or quit completely, you may have more energy, and you may have an increase in appetite. The second trimester is also a time when the fetus is growing rapidly. At the end of the sixth month, the fetus is about 9 inches long and weighs about 1 pounds. You will likely begin to feel the baby move (quickening) between 18 and 20 weeks of the pregnancy. BODY CHANGES Your body goes through many changes during pregnancy. The changes vary from woman to woman.  Your weight will continue to increase. You will notice your lower abdomen bulging out. You may begin to get stretch marks on your  hips, abdomen, and breasts. You may develop headaches that can be relieved by medicines approved by your health care provider. You may urinate more often because the fetus is pressing on your bladder. You may develop or continue to have heartburn as a result of your pregnancy. You may develop constipation because certain hormones are causing the muscles that push waste through your intestines to slow down. You may develop hemorrhoids or swollen, bulging veins (varicose veins). You may have back pain because of the weight gain and pregnancy hormones relaxing your joints between the bones in your pelvis and as a result of a shift in weight and the muscles that support your balance. Your breasts will continue to grow and be tender. Your gums may bleed and may be sensitive to brushing and flossing. Dark spots or blotches (chloasma, mask of pregnancy) may develop on your face. This will likely fade after the baby is born. A dark line from your belly button to the pubic area (linea nigra) may appear. This  will likely fade after the baby is born. You may have changes in your hair. These can include thickening of your hair, rapid growth, and changes in texture. Some women also have hair loss during or after pregnancy, or hair that feels dry or thin. Your hair will most likely return to normal after your baby is born. WHAT TO EXPECT AT YOUR PRENATAL VISITS During a routine prenatal visit: You will be weighed to make sure you and the fetus are growing normally. Your blood pressure will be taken. Your abdomen will be measured to track your baby's growth. The fetal heartbeat will be listened to. Any test results from the previous visit will be discussed. Your health care provider may ask you: How you are feeling. If you are feeling the baby move. If you have had any abnormal symptoms, such as leaking fluid, bleeding, severe headaches, or abdominal cramping. If you have any questions. Other tests that may  be performed during your second trimester include: Blood tests that check for: Low iron levels (anemia). Gestational diabetes (between 24 and 28 weeks). Rh antibodies. Urine tests to check for infections, diabetes, or protein in the urine. An ultrasound to confirm the proper growth and development of the baby. An amniocentesis to check for possible genetic problems. Fetal screens for spina bifida and Down syndrome. HOME CARE INSTRUCTIONS  Avoid all smoking, herbs, alcohol, and unprescribed drugs. These chemicals affect the formation and growth of the baby. Follow your health care provider's instructions regarding medicine use. There are medicines that are either safe or unsafe to take during pregnancy. Exercise only as directed by your health care provider. Experiencing uterine cramps is a good sign to stop exercising. Continue to eat regular, healthy meals. Wear a good support bra for breast tenderness. Do not use hot tubs, steam rooms, or saunas. Wear your seat belt at all times when driving. Avoid raw meat, uncooked cheese, cat litter boxes, and soil used by cats. These carry germs that can cause birth defects in the baby. Take your prenatal vitamins. Try taking a stool softener (if your health care provider approves) if you develop constipation. Eat more high-fiber foods, such as fresh vegetables or fruit and whole grains. Drink plenty of fluids to keep your urine clear or pale yellow. Take warm sitz baths to soothe any pain or discomfort caused by hemorrhoids. Use hemorrhoid cream if your health care provider approves. If you develop varicose veins, wear support hose. Elevate your feet for 15 minutes, 3-4 times a day. Limit salt in your diet. Avoid heavy lifting, wear low heel shoes, and practice good posture. Rest with your legs elevated if you have leg cramps or low back pain. Visit your dentist if you have not gone yet during your pregnancy. Use a soft toothbrush to brush your teeth  and be gentle when you floss. A sexual relationship may be continued unless your health care provider directs you otherwise. Continue to go to all your prenatal visits as directed by your health care provider. SEEK MEDICAL CARE IF:  You have dizziness. You have mild pelvic cramps, pelvic pressure, or nagging pain in the abdominal area. You have persistent nausea, vomiting, or diarrhea. You have a bad smelling vaginal discharge. You have pain with urination. SEEK IMMEDIATE MEDICAL CARE IF:  You have a fever. You are leaking fluid from your vagina. You have spotting or bleeding from your vagina. You have severe abdominal cramping or pain. You have rapid weight gain or loss. You have shortness of   breath with chest pain. You notice sudden or extreme swelling of your face, hands, ankles, feet, or legs. You have not felt your baby move in over an hour. You have severe headaches that do not go away with medicine. You have vision changes. Document Released: 04/21/2001 Document Revised: 05/02/2013 Document Reviewed: 06/28/2012 [facility] Patient Information 2015 [place], [place]. This information is not intended to replace advice given to you by your health care provider. Make sure you discuss any questions you have with your health care provider.

## 2021-10-13 NOTE — Progress Notes (Signed)
HIGH-RISK PREGNANCY VISIT Patient name: Nichole Lang MRN [id number]  Date of birth: 1990-08-12 Chief Complaint:   Routine Prenatal Visit  History of Present Illness:   Nichole Lang is a 31 y.o. G62P1001 female at [redacted]w[redacted]d with an Estimated Date of Delivery: 02/04/22 being seen today for ongoing management of a high-risk pregnancy complicated by chronic hypertension currently on labetalol 200mg  BID.    Today she reports no complaints. Going on cruise to [place] in a couple of weeks. Contractions: Not present. Vag. Bleeding: None.  Movement: Present. denies leaking of fluid.      07/28/2021    2:40 PM  Depression screen PHQ 2/9  Decreased Interest 0  Down, Depressed, Hopeless 0  PHQ - 2 Score 0  Altered sleeping 0  Tired, decreased energy 1  Change in appetite 0  Feeling bad or failure about yourself  0  Trouble concentrating 0  Moving slowly or fidgety/restless 0  Suicidal thoughts 0  PHQ-9 Score 1        07/28/2021    2:40 PM  GAD 7 : Generalized Anxiety Score  Nervous, Anxious, on Edge 0  Control/stop worrying 0  Worry too much - different things 0  Trouble relaxing 0  Restless 0  Easily annoyed or irritable 0  Afraid - awful might happen 0  Total GAD 7 Score 0     Review of Systems:   Pertinent items are noted in HPI Denies abnormal vaginal discharge w/ itching/odor/irritation, headaches, visual changes, shortness of breath, chest pain, abdominal pain, severe nausea/vomiting, or problems with urination or bowel movements unless otherwise stated above. Pertinent History Reviewed:  Reviewed past medical,surgical, social, obstetrical and family history.  Reviewed problem list, medications and allergies. Physical Assessment:   Vitals:   10/13/21 1333  BP: 133/87  Pulse: 88  Weight: (!) 301 lb (136.5 kg)  Body mass index is 47.14 kg/m.           Physical Examination:   General appearance: alert, well appearing, and in no distress  Mental status: alert,  oriented to person, place, and time  Skin: warm & dry   Extremities: Edema: None    Cardiovascular: normal heart rate noted  Respiratory: normal respiratory effort, no distress  Abdomen: gravid, soft, non-tender  Pelvic: Cervical exam deferred         Fetal Status: Fetal Heart Rate (bpm): 147 Fundal Height: 26 cm Movement: Present    Fetal Surveillance Testing today: doppler   Chaperone: N/A    No results found for this or any previous visit (from the past 24 hour(s)).  Assessment & Plan:  High-risk pregnancy: G2P1001 at [redacted]w[redacted]d with an Estimated Date of Delivery: 02/04/22   1) CHTN, stable on labetalol 200mg  BID, ASA 162mg   2) Traveling, Discussed she is at a high risk for DVT/PE when pregnancy and traveling increases that risk. Continue ASA daily. Recommended walking around q 1hr, don't cross legs, and dorsiflex feet often to help decrease r/f DVT/PE. Travel not recommended if ptl s/s or close to/at term.    Meds: No orders of the defined types were placed in this encounter.   Labs/procedures today: none  Treatment Plan:   Growth u/s q 4wks    2x/wk testing nst/sono @ 32wks     Deliver 38-39wks (37wks or prn if poor control)____   Reviewed: Preterm labor symptoms and general obstetric precautions including but not limited to vaginal bleeding, contractions, leaking of fluid and fetal movement were reviewed in detail with the patient.  All questions were answered. Does have home bp cuff. Office bp cuff given: not applicable. Check bp weekly, let us know if consistently >140 and/or >90.  Follow-up: Return in about 4 weeks (around 11/10/2021) for HROB, PN2, US:EFW, MD or CNM, in person.   No future appointments.  Orders Placed This Encounter  Procedures   US OB Follow Up   POC Urinalysis Dipstick OB   Roma Schanz CNM, [facility] 10/13/2021 1:59 PM

## 2021-10-16 ENCOUNTER — Telehealth: Payer: 59 | Admitting: Physician Assistant

## 2021-10-16 DIAGNOSIS — N898 Other specified noninflammatory disorders of vagina: Secondary | ICD-10-CM

## 2021-10-17 NOTE — Progress Notes (Signed)
For the safety of you and your child, I recommend a face to face office visit with a health care provider.  Many mothers need to take medicines during their pregnancy and while nursing.  Almost all medicines pass into the breast milk in small quantities.  Most are generally considered safe for a mother to take but some medicines must be avoided.  After reviewing your E-Visit request, I recommend that you consult your OB/GYN or pediatrician for medical advice in relation to your condition and prescription medications while pregnant or breastfeeding.  NOTE:  There will be NO CHARGE for this eVisit  If you are having a true medical emergency please call 911.    For an urgent face to face visit, [place] has six urgent care centers for your convenience:     [facility] [place] Get Driving Directions [phone] [address] [place], [place] [address]    [facility] ([place]) Get Driving Directions [phone] [address] [place], [place] [address]  [facility] ([place]) Get Driving Directions [phone] [address] [place],  [place]  [address]  [facility] Get Driving Directions [phone] [address] [place], [place] [address]   [facility] [place] Get Driving Directions  [phone] [address] [place], [place] [address]    [facility] [place] Get Driving Directions [phone] [address] [place], [place] [address]  Your MyChart E-visit questionnaire answers were reviewed by a board certified advanced clinical practitioner to complete your personal care plan based on your specific symptoms.  Thank you for using e-Visits.   I provided 5 minutes of non face-to-face time during this encounter for chart review and documentation.   

## 2021-11-18 ENCOUNTER — Other Ambulatory Visit: Payer: 59

## 2021-11-18 ENCOUNTER — Ambulatory Visit (INDEPENDENT_AMBULATORY_CARE_PROVIDER_SITE_OTHER): Payer: 59 | Admitting: Obstetrics & Gynecology

## 2021-11-18 ENCOUNTER — Ambulatory Visit (INDEPENDENT_AMBULATORY_CARE_PROVIDER_SITE_OTHER): Payer: 59

## 2021-11-18 ENCOUNTER — Encounter: Payer: Self-pay | Admitting: Obstetrics & Gynecology

## 2021-11-18 VITALS — BP 141/92 | HR 90 | Wt 299.0 lb

## 2021-11-18 DIAGNOSIS — Z3A28 28 weeks gestation of pregnancy: Secondary | ICD-10-CM

## 2021-11-18 DIAGNOSIS — O0990 Supervision of high risk pregnancy, unspecified, unspecified trimester: Secondary | ICD-10-CM

## 2021-11-18 DIAGNOSIS — O0992 Supervision of high risk pregnancy, unspecified, second trimester: Secondary | ICD-10-CM | POA: Diagnosis not present

## 2021-11-18 DIAGNOSIS — O10919 Unspecified pre-existing hypertension complicating pregnancy, unspecified trimester: Secondary | ICD-10-CM

## 2021-11-18 DIAGNOSIS — O0993 Supervision of high risk pregnancy, unspecified, third trimester: Secondary | ICD-10-CM

## 2021-11-18 DIAGNOSIS — Z6791 Unspecified blood type, Rh negative: Secondary | ICD-10-CM

## 2021-11-18 DIAGNOSIS — D573 Sickle-cell trait: Secondary | ICD-10-CM

## 2021-11-18 DIAGNOSIS — Z131 Encounter for screening for diabetes mellitus: Secondary | ICD-10-CM

## 2021-11-18 NOTE — Progress Notes (Signed)
[place] 28+6 wks,cephalic,anterior placenta gr 1,AFI 12.1 cm,fhr 152 bpm,normal ovaries,EFW 1207 g 20%

## 2021-11-18 NOTE — Progress Notes (Signed)
HIGH-RISK PREGNANCY VISIT Patient name: Nichole Lang MRN [id number]  Date of birth: Mar 21, 1991 Chief Complaint:   Routine Prenatal Visit  History of Present Illness:   Nichole Lang is a 31 y.o. G47P1001 female at [redacted]w[redacted]d with an Estimated Date of Delivery: 02/04/22 being seen today for ongoing management of a high-risk pregnancy complicated by chronic hypertension currently on labetalol 200 BID(didn't take this am due to PN2)    Today she reports no complaints. Contractions: Not present. Vag. Bleeding: None.  Movement: Present. denies leaking of fluid.      11/18/2021    9:57 AM 07/28/2021    2:40 PM  Depression screen PHQ 2/9  Decreased Interest 0 0  Down, Depressed, Hopeless 0 0  PHQ - 2 Score 0 0  Altered sleeping 0 0  Tired, decreased energy 1 1  Change in appetite 0 0  Feeling bad or failure about yourself  0 0  Trouble concentrating 0 0  Moving slowly or fidgety/restless 0 0  Suicidal thoughts 0 0  PHQ-9 Score 1 1        07/28/2021    2:40 PM  GAD 7 : Generalized Anxiety Score  Nervous, Anxious, on Edge 0  Control/stop worrying 0  Worry too much - different things 0  Trouble relaxing 0  Restless 0  Easily annoyed or irritable 0  Afraid - awful might happen 0  Total GAD 7 Score 0     Review of Systems:   Pertinent items are noted in HPI Denies abnormal vaginal discharge w/ itching/odor/irritation, headaches, visual changes, shortness of breath, chest pain, abdominal pain, severe nausea/vomiting, or problems with urination or bowel movements unless otherwise stated above. Pertinent History Reviewed:  Reviewed past medical,surgical, social, obstetrical and family history.  Reviewed problem list, medications and allergies. Physical Assessment:   Vitals:   11/18/21 1003  BP: (!) 141/92  Pulse: 90  Weight: 299 lb (135.6 kg)  Body mass index is 46.83 kg/m.           Physical Examination:   General appearance: alert, well appearing, and in no  distress  Mental status: alert, oriented to person, place, and time  Skin: warm & dry   Extremities: Edema: None    Cardiovascular: normal heart rate noted  Respiratory: normal respiratory effort, no distress  Abdomen: gravid, soft, non-tender  Pelvic: Cervical exam deferred         Fetal Status:     Movement: Present    Fetal Surveillance Testing today: sonogram 20%   Chaperone: N/A    No results found for this or any previous visit (from the past 24 hour(s)).  Assessment & Plan:  High-risk pregnancy: G2P1001 at [redacted]w[redacted]d with an Estimated Date of Delivery: 02/04/22      ICD-10-CM   1. Supervision of high-risk pregnancy, third trimester  O09.93 POC Urinalysis Dipstick OB    2. Chronic hypertension affecting pregnancy  O10.919    labetalol 200 BID    3. [redacted] weeks gestation of pregnancy  Z3A.28 POC Urinalysis Dipstick OB        Meds: No orders of the defined types were placed in this encounter.   Orders:  Orders Placed This Encounter  Procedures   POC Urinalysis Dipstick OB     Labs/procedures today: U/S  Treatment Plan:  per protocol  Reviewed: Preterm labor symptoms and general obstetric precautions including but not limited to vaginal bleeding, contractions, leaking of fluid and fetal movement were reviewed in detail with the patient.  All questions were answered. Does have home bp cuff. Office bp cuff given: not applicable. Check bp daily, let us know if consistently >150 and/or >95.  Follow-up: Return in about 3 weeks (around 12/09/2021) for HROB.   No future appointments.  Orders Placed This Encounter  Procedures   POC Urinalysis Dipstick OB   Lazaro Arms  Attending Physician for the Center for [facility] 11/18/2021 10:35 AM

## 2021-11-19 LAB — GLUCOSE TOLERANCE, 2 HOURS W/ 1HR
Glucose, 1 hour: 142 mg/dL (ref 70–179)
Glucose, 2 hour: 115 mg/dL (ref 70–152)
Glucose, Fasting: 91 mg/dL (ref 70–91)

## 2021-11-19 LAB — CBC
Hematocrit: 33.2 % — ABNORMAL LOW (ref 34.0–46.6)
Hemoglobin: 11 g/dL — ABNORMAL LOW (ref 11.1–15.9)
MCH: 26.3 pg — ABNORMAL LOW (ref 26.6–33.0)
MCHC: 33.1 g/dL (ref 31.5–35.7)
MCV: 79 fL (ref 79–97)
Platelets: 223 10*3/uL (ref 150–450)
RBC: 4.18 x10E6/uL (ref 3.77–5.28)
RDW: 13.8 % (ref 11.7–15.4)
WBC: 17 10*3/uL — ABNORMAL HIGH (ref 3.4–10.8)

## 2021-11-19 LAB — ANTIBODY SCREEN: Antibody Screen: NEGATIVE

## 2021-11-19 LAB — RPR: RPR Ser Ql: NONREACTIVE

## 2021-11-19 LAB — HIV ANTIBODY (ROUTINE TESTING W REFLEX): HIV Screen 4th Generation wRfx: NONREACTIVE

## 2021-12-09 ENCOUNTER — Encounter: Payer: Self-pay | Admitting: Women's Health

## 2021-12-09 ENCOUNTER — Ambulatory Visit (INDEPENDENT_AMBULATORY_CARE_PROVIDER_SITE_OTHER): Payer: 59 | Admitting: Women's Health

## 2021-12-09 VITALS — BP 126/86 | HR 102 | Wt 294.0 lb

## 2021-12-09 DIAGNOSIS — O26892 Other specified pregnancy related conditions, second trimester: Secondary | ICD-10-CM | POA: Diagnosis not present

## 2021-12-09 DIAGNOSIS — Z23 Encounter for immunization: Secondary | ICD-10-CM | POA: Diagnosis not present

## 2021-12-09 DIAGNOSIS — O0993 Supervision of high risk pregnancy, unspecified, third trimester: Secondary | ICD-10-CM

## 2021-12-09 DIAGNOSIS — O10919 Unspecified pre-existing hypertension complicating pregnancy, unspecified trimester: Secondary | ICD-10-CM

## 2021-12-09 DIAGNOSIS — Z6791 Unspecified blood type, Rh negative: Secondary | ICD-10-CM

## 2021-12-09 DIAGNOSIS — O0990 Supervision of high risk pregnancy, unspecified, unspecified trimester: Secondary | ICD-10-CM

## 2021-12-09 LAB — POCT URINALYSIS DIPSTICK
Bilirubin, UA: NEGATIVE
Blood, UA: NEGATIVE
Glucose, UA: NEGATIVE
Ketones, UA: NEGATIVE
Leukocytes, UA: NEGATIVE
Nitrite, UA: NEGATIVE
Protein, UA: NEGATIVE
Spec Grav, UA: 1.01 (ref 1.010–1.025)
Urobilinogen, UA: 0.2 E.U./dL
pH, UA: 5.5 (ref 5.0–8.0)

## 2021-12-09 MED ORDER — RHO D IMMUNE GLOBULIN 1500 UNIT/2ML IJ SOSY
300.0000 ug | PREFILLED_SYRINGE | Freq: Once | INTRAMUSCULAR | Status: DC
  Administered 2021-12-09: 300 ug via INTRAMUSCULAR

## 2021-12-09 NOTE — Progress Notes (Signed)
ROB 31.[redacted] wks GA  Reports "pelvic pain" maternity belt advised Rhogam today/ done

## 2021-12-09 NOTE — Progress Notes (Signed)
HIGH-RISK PREGNANCY VISIT Patient name: Nichole Lang MRN [id number]  Date of birth: Aug 28, 1990 Chief Complaint:   Routine Prenatal Visit  History of Present Illness:   Nichole Lang is a 31 y.o. G60P1001 female at [redacted]w[redacted]d with an Estimated Date of Delivery: 02/04/22 being seen today for ongoing management of a high-risk pregnancy complicated by chronic hypertension currently on labetalol 200mg  BID.    Today she reports  pelvic pain w/ movement/working . Contractions: Not present.  .  Movement: Present. denies leaking of fluid.      11/18/2021    9:57 AM 07/28/2021    2:40 PM  Depression screen PHQ 2/9  Decreased Interest 0 0  Down, Depressed, Hopeless 0 0  PHQ - 2 Score 0 0  Altered sleeping 0 0  Tired, decreased energy 1 1  Change in appetite 0 0  Feeling bad or failure about yourself  0 0  Trouble concentrating 0 0  Moving slowly or fidgety/restless 0 0  Suicidal thoughts 0 0  PHQ-9 Score 1 1        07/28/2021    2:40 PM  GAD 7 : Generalized Anxiety Score  Nervous, Anxious, on Edge 0  Control/stop worrying 0  Worry too much - different things 0  Trouble relaxing 0  Restless 0  Easily annoyed or irritable 0  Afraid - awful might happen 0  Total GAD 7 Score 0     Review of Systems:   Pertinent items are noted in HPI Denies abnormal vaginal discharge w/ itching/odor/irritation, headaches, visual changes, shortness of breath, chest pain, abdominal pain, severe nausea/vomiting, or problems with urination or bowel movements unless otherwise stated above. Pertinent History Reviewed:  Reviewed past medical,surgical, social, obstetrical and family history.  Reviewed problem list, medications and allergies. Physical Assessment:   Vitals:   12/09/21 1007  BP: 126/86  Pulse: (!) 102  Weight: 294 lb (133.4 kg)  Body mass index is 46.05 kg/m.           Physical Examination:   General appearance: alert, well appearing, and in no distress  Mental status: alert,  oriented to person, place, and time  Skin: warm & dry   Extremities: Edema: None    Cardiovascular: normal heart rate noted  Respiratory: normal respiratory effort, no distress  Abdomen: gravid, soft, non-tender  Pelvic: Cervical exam deferred         Fetal Status: Fetal Heart Rate (bpm): 145 Fundal Height: 31 cm Movement: Present    Fetal Surveillance Testing today: doppler   Chaperone: N/A    No results found for this or any previous visit (from the past 24 hour(s)).  Assessment & Plan:  High-risk pregnancy: G2P1001 at [redacted]w[redacted]d with an Estimated Date of Delivery: 02/04/22   1) CHTN, stable on labetalol 200mg  BID, ASA, start antenatal testing  2) Wants BTL, currently has FP Mcaid listed as secondary, per 02/06/22 they won't pay. Pt in process of getting other insurance, will let know if regular or pregnancy Mcaid & will sign consent then  Meds:  Meds ordered this encounter  Medications   DISCONTD: rho (d) immune globulin (RHIG/RHOPHYLAC) injection 300 mcg    Labs/procedures today: tdap and Rhogam  Treatment Plan:   Kara Mead 32, 36wks    2x/wk testing nst/sono @ 32wks     Deliver 37-39wks (36-37wks or prn if poor control)____   Reviewed: Preterm labor symptoms and general obstetric precautions including but not limited to vaginal bleeding, contractions, leaking of fluid and fetal movement  were reviewed in detail with the patient.  All questions were answered. Does have home bp cuff. Office bp cuff given: not applicable. Check bp daily, let us know if consistently >140 and/or >90.  Follow-up: Return for start friday nst/nurse; tues bpp/dopp w/ md/cnm now til 39wks.   Future Appointments  Date Time Provider Department Center  12/12/2021 11:50 AM [facility] NURSE [facility]    Orders Placed This Encounter  Procedures   Tdap vaccine greater than or equal to 7yo IM   RHO (D) Immune Globulin   POCT Urinalysis Dipstick   Cheral Marker CNM, [facility] 12/09/2021 11:30 AM

## 2021-12-09 NOTE — Patient Instructions (Signed)
Nichole Lang, thank you for choosing our office today! We appreciate the opportunity to meet your healthcare needs. You may receive a short survey by mail, e-mail, or through MyChart. If you are happy with your care we would appreciate if you could take just a few minutes to complete the survey questions. We read all of your comments and take your feedback very seriously. Thank you again for choosing our office.  [facility] [place] ([address] [place], [place] [address]) Entrance C, located off of [address] Free 24/7 valet parking   CLASSES: Go to [email or web address] to register for classes (childbirth, breastfeeding, waterbirth, infant CPR, daddy bootcamp, etc.)  Call the office ([phone]) or go to [facility] if: You begin to have strong, frequent contractions Your water breaks.  Sometimes it is a big gush of fluid, sometimes it is just a trickle that keeps getting your panties wet or running down your legs You have vaginal bleeding.  It is normal to have a small amount of spotting if your cervix was checked.  You don't feel your baby moving like normal.  If you don't, get you something to eat and drink and lay down and focus on feeling your baby move.   If your baby is still not moving like normal, you should call the office or go to [facility].  Call the office ([phone]) or go to [facility] for these signs of pre-eclampsia: Severe headache that does not go away with Tylenol Visual changes- seeing spots, double, blurred vision Pain under your right breast or upper abdomen that does not go away with Tums or heartburn medicine Nausea and/or vomiting Severe swelling in your hands, feet, and face   Tdap Vaccine It is recommended that you get the Tdap vaccine during the third trimester of EACH pregnancy to help protect your baby from getting pertussis (whooping cough) 27-36 weeks is the BEST time to do  this so that you can pass the protection on to your baby. During pregnancy is better than after pregnancy, but if you are unable to get it during pregnancy it will be offered at the hospital.  You can get this vaccine with us, at the health department, your family doctor, or some local pharmacies Everyone who will be around your baby should also be up-to-date on their vaccines before the baby comes. Adults (who are not pregnant) only need 1 dose of Tdap during adulthood.   [place] Pediatricians/Family Doctors [facility] ([facility]): [address], [phone]           [facility]: [address], [phone]                [facility] ([facility]): [address], [phone] (call to ask if accepting patients) [facility]: [address], [place], [phone]    [place] Pediatricians/Family Doctors [facility] ([facility]): [address], [phone] [facility]: [address], [phone] [facility]: [address], [phone]  [place] [facility]  [facility] ([facility]): [phone] [facility]: [address], [phone]   [place] Family Doctors [facility]: [address], [phone]  [place] [facility]  [facility]: [address], [phone]  Home Blood Pressure Monitoring for Patients   Your provider has recommended that you check your   blood pressure (BP) at least once a week at home. If you do not have a blood pressure cuff at home, one will be provided for you. Contact your provider if you have not received your monitor within 1 week.   Helpful Tips for Accurate Home Blood Pressure Checks  Don't smoke, exercise, or drink caffeine 30 minutes before checking your BP Use the restroom before checking your BP (a full bladder can raise your  pressure) Relax in a comfortable upright chair Feet on the ground Left arm resting comfortably on a flat surface at the level of your heart Legs uncrossed Back supported Sit quietly and don't talk Place the cuff on your bare arm Adjust snuggly, so that only two fingertips can fit between your skin and the top of the cuff Check 2 readings separated by at least one minute Keep a log of your BP readings For a visual, please reference this diagram: http://ccnc.care/bpdiagram  Provider Name: [facility]     Phone: [phone]  Zone 1: ALL CLEAR  Continue to monitor your symptoms:  BP reading is less than 140 (top number) or less than 90 (bottom number)  No right upper stomach pain No headaches or seeing spots No feeling nauseated or throwing up No swelling in face and hands  Zone 2: CAUTION Call your doctor's office for any of the following:  BP reading is greater than 140 (top number) or greater than 90 (bottom number)  Stomach pain under your ribs in the middle or right side Headaches or seeing spots Feeling nauseated or throwing up Swelling in face and hands  Zone 3: EMERGENCY  Seek immediate medical care if you have any of the following:  BP reading is greater than160 (top number) or greater than 110 (bottom number) Severe headaches not improving with Tylenol Serious difficulty catching your breath Any worsening symptoms from Zone 2  Preterm Labor and Birth Information  The normal length of a pregnancy is 39-41 weeks. Preterm labor is when labor starts before 37 completed weeks of pregnancy. What are the risk factors for preterm labor? Preterm labor is more likely to occur in women who: Have certain infections during pregnancy such as a bladder infection, sexually transmitted infection, or infection inside the uterus (chorioamnionitis). Have a shorter-than-normal cervix. Have gone into preterm labor before. Have had surgery on their cervix. Are younger than age 17  or older than age 35. Are African American. Are pregnant with twins or multiple babies (multiple gestation). Take street drugs or smoke while pregnant. Do not gain enough weight while pregnant. Became pregnant shortly after having been pregnant. What are the symptoms of preterm labor? Symptoms of preterm labor include: Cramps similar to those that can happen during a menstrual period. The cramps may happen with diarrhea. Pain in the abdomen or lower back. Regular uterine contractions that may feel like tightening of the abdomen. A feeling of increased pressure in the pelvis. Increased watery or bloody mucus discharge from the vagina. Water breaking (ruptured amniotic sac). Why is it important to recognize signs of preterm labor? It is important to recognize signs of preterm labor because babies who are born prematurely may not be fully developed. This can put them at an increased risk for: Long-term (chronic) heart and lung problems. Difficulty immediately after birth with regulating body systems, including blood sugar, body temperature, heart rate, and breathing rate. Bleeding in the brain. Cerebral palsy. Learning difficulties. Death. These risks are highest for babies who are born before 34 weeks   of pregnancy. How is preterm labor treated? Treatment depends on the length of your pregnancy, your condition, and the health of your baby. It may involve: Having a stitch (suture) placed in your cervix to prevent your cervix from opening too early (cerclage). Taking or being given medicines, such as: Hormone medicines. These may be given early in pregnancy to help support the pregnancy. Medicine to stop contractions. Medicines to help mature the baby's lungs. These may be prescribed if the risk of delivery is high. Medicines to prevent your baby from developing cerebral palsy. If the labor happens before 34 weeks of pregnancy, you may need to stay in the hospital. What should I do if I  think I am in preterm labor? If you think that you are going into preterm labor, call your health care provider right away. How can I prevent preterm labor in future pregnancies? To increase your chance of having a full-term pregnancy: Do not use any tobacco products, such as cigarettes, chewing tobacco, and e-cigarettes. If you need help quitting, ask your health care provider. Do not use street drugs or medicines that have not been prescribed to you during your pregnancy. Talk with your health care provider before taking any herbal supplements, even if you have been taking them regularly. Make sure you gain a healthy amount of weight during your pregnancy. Watch for infection. If you think that you might have an infection, get it checked right away. Make sure to tell your health care provider if you have gone into preterm labor before. This information is not intended to replace advice given to you by your health care provider. Make sure you discuss any questions you have with your health care provider. Document Revised: 08/19/2018 Document Reviewed: 09/18/2015 Elsevier Patient Education  2020 Elsevier Inc.   

## 2021-12-12 ENCOUNTER — Ambulatory Visit (INDEPENDENT_AMBULATORY_CARE_PROVIDER_SITE_OTHER): Payer: 59 | Admitting: *Deleted

## 2021-12-12 VITALS — BP 132/89 | HR 96 | Wt 302.4 lb

## 2021-12-12 DIAGNOSIS — I10 Essential (primary) hypertension: Secondary | ICD-10-CM

## 2021-12-12 DIAGNOSIS — O0990 Supervision of high risk pregnancy, unspecified, unspecified trimester: Secondary | ICD-10-CM

## 2021-12-12 NOTE — Progress Notes (Signed)
   NURSE VISIT- NST  SUBJECTIVE:  Nysa Sarin is a 31 y.o. G2P1001 female at [redacted]w[redacted]d, here for a NST for pregnancy complicated by [facility].  She reports active fetal movement, contractions: none, vaginal bleeding: none, membranes: intact.   OBJECTIVE:  BP 132/89   Pulse 96   Wt (!) 302 lb 6.4 oz (137.2 kg)   LMP 04/30/2021   BMI 47.36 kg/m   Appears well, no apparent distress  No results found for this or any previous visit (from the past 24 hour(s)).  NST: FHR baseline 135 bpm, Variability: moderate, Accelerations:present, Decelerations:  Absent= Cat 1/reactive Toco: none   ASSESSMENT: G2P1001 at [redacted]w[redacted]d with CHTN NST reactive  PLAN: EFM strip reviewed by Dr. Despina Hidden   Recommendations: keep next appointment as scheduled    Annamarie Dawley  12/12/2021 12:50 PM

## 2021-12-15 ENCOUNTER — Other Ambulatory Visit: Payer: Self-pay | Admitting: Women's Health

## 2021-12-15 DIAGNOSIS — O10919 Unspecified pre-existing hypertension complicating pregnancy, unspecified trimester: Secondary | ICD-10-CM

## 2021-12-16 ENCOUNTER — Encounter: Payer: Self-pay | Admitting: Obstetrics & Gynecology

## 2021-12-16 ENCOUNTER — Ambulatory Visit (INDEPENDENT_AMBULATORY_CARE_PROVIDER_SITE_OTHER): Payer: 59 | Admitting: Obstetrics & Gynecology

## 2021-12-16 ENCOUNTER — Ambulatory Visit (INDEPENDENT_AMBULATORY_CARE_PROVIDER_SITE_OTHER): Payer: 59

## 2021-12-16 VITALS — BP 145/90 | HR 90 | Wt 301.2 lb

## 2021-12-16 DIAGNOSIS — D573 Sickle-cell trait: Secondary | ICD-10-CM

## 2021-12-16 DIAGNOSIS — Z3A32 32 weeks gestation of pregnancy: Secondary | ICD-10-CM

## 2021-12-16 DIAGNOSIS — O10919 Unspecified pre-existing hypertension complicating pregnancy, unspecified trimester: Secondary | ICD-10-CM | POA: Diagnosis not present

## 2021-12-16 DIAGNOSIS — O0990 Supervision of high risk pregnancy, unspecified, unspecified trimester: Secondary | ICD-10-CM

## 2021-12-16 DIAGNOSIS — O26899 Other specified pregnancy related conditions, unspecified trimester: Secondary | ICD-10-CM

## 2021-12-16 DIAGNOSIS — O365990 Maternal care for other known or suspected poor fetal growth, unspecified trimester, not applicable or unspecified: Secondary | ICD-10-CM

## 2021-12-16 LAB — POCT URINALYSIS DIPSTICK OB
Blood, UA: NEGATIVE
Glucose, UA: NEGATIVE
Ketones, UA: NEGATIVE
Nitrite, UA: NEGATIVE
POC,PROTEIN,UA: NEGATIVE

## 2021-12-16 NOTE — Progress Notes (Signed)
HIGH-RISK PREGNANCY VISIT Patient name: Nichole Lang MRN [id number]  Date of birth: Jan 27, 1991 Chief Complaint:   High Risk Gestation ([place] today)  History of Present Illness:   Nichole Lang is a 31 y.o. G27P1001 female at [redacted]w[redacted]d with an Estimated Date of Delivery: 02/04/22 being seen today for ongoing management of a high-risk pregnancy complicated by: Chronic HTN- on labetalol 200mg  bid 2Today she reports no complaints.   Contractions: Not present. Vag. Bleeding: None.  Movement: Present. denies leaking of fluid.      11/18/2021    9:57 AM 07/28/2021    2:40 PM  Depression screen PHQ 2/9  Decreased Interest 0 0  Down, Depressed, Hopeless 0 0  PHQ - 2 Score 0 0  Altered sleeping 0 0  Tired, decreased energy 1 1  Change in appetite 0 0  Feeling bad or failure about yourself  0 0  Trouble concentrating 0 0  Moving slowly or fidgety/restless 0 0  Suicidal thoughts 0 0  PHQ-9 Score 1 1     Current Outpatient Medications  Medication Instructions   aspirin EC 162 mg, Oral, Daily   Fexofenadine HCl (ALLEGRA PO) Take by mouth.   labetalol (NORMODYNE) 200 mg, Oral, 2 times daily   Prenat w/o A Vit-FeFum-FePo-FA (PROVIDA OB PO) 1 tablet, Oral, Daily     Review of Systems:   Pertinent items are noted in HPI Denies abnormal vaginal discharge w/ itching/odor/irritation, headaches, visual changes, shortness of breath, chest pain, abdominal pain, severe nausea/vomiting, or problems with urination or bowel movements unless otherwise stated above. Pertinent History Reviewed:  Reviewed past medical,surgical, social, obstetrical and family history.  Reviewed problem list, medications and allergies. Physical Assessment:   Vitals:   12/16/21 1425  BP: (!) 145/90  Pulse: 90  Weight: (!) 301 lb 3.2 oz (136.6 kg)  Body mass index is 47.17 kg/m.           Physical Examination:   General appearance: alert, well appearing, and in no distress  Mental status: normal mood, behavior,  speech, dress, motor activity, and thought processes  Skin: warm & dry   Extremities: Edema: None    Cardiovascular: normal heart rate noted  Respiratory: normal respiratory effort, no distress  Abdomen: gravid, soft, non-tender  Pelvic: Cervical exam deferred         Fetal Status:     Movement: Present    Fetal Surveillance Testing today: cephalic,BPP 8/8,FHR 154 bpm,elevated UAD with EDF,RI .71,.69,.71,.74=91%,EFW 1720 g 7.3%,AC 8.7%,anterior placenta gr 2   Chaperone: N/A    Results for orders placed or performed in visit on 12/16/21 (from the past 24 hour(s))  POC Urinalysis Dipstick OB   Collection Time: 12/16/21  2:31 PM  Result Value Ref Range   Color, UA     Clarity, UA     Glucose, UA Negative Negative   Bilirubin, UA     Ketones, UA neg    Spec Grav, UA     Blood, UA neg    pH, UA     POC,PROTEIN,UA Negative Negative, Trace, Small (1+), Moderate (2+), Large (3+), 4+   Urobilinogen, UA     Nitrite, UA neg    Leukocytes, UA Trace (A) Negative   Appearance     Odor       Assessment & Plan:  High-risk pregnancy: G2P1001 at [redacted]w[redacted]d with an Estimated Date of Delivery: 02/04/22   1) Chronic HTN now with FGR -while today's BP elevated, reviewed home BPs, all within normal range -  continue current meds -UA neg, pt asymptomatic -discussed US findings that now suggest FGR -continue BPP/dopplers/NST weekly -discussed IOL 37-38wks  -Rh neg -obesity -Contraceptive management Does not desire future pregnancy.  Discussed sterilization via tubal ligation/salpingectomy.  Risk/benefit and alternatives reviewed including but not limited to risk of bleeding, infection and injury to surrounding organs.  Inform consent obtained  Meds: No orders of the defined types were placed in this encounter.   Labs/procedures today: BPP/growth  Treatment Plan:  as outlined above  Reviewed: Preterm labor symptoms and general obstetric precautions including but not limited to vaginal  bleeding, contractions, leaking of fluid and fetal movement were reviewed in detail with the patient.  Reviewed preeclampsia precautions.  All questions were answered. Pt has home bp cuff. Check bp weekly, let us know if >140/90.   Follow-up: Return for as scheduled twice weekly BPP/NST.   Future Appointments  Date Time Provider Department Center  12/19/2021 11:30 AM [facility] NURSE [facility]  12/23/2021 10:50 AM [facility]  12/23/2021 11:10 AM Lazaro Arms, MD [facility]  12/26/2021 11:30 AM [facility]  12/30/2021  1:30 PM [facility] [place] [facility] None  12/30/2021  2:30 PM Cheral Marker, CNM [facility]  01/02/2022 11:10 AM [facility]  01/06/2022  3:45 PM [facility] [place] [facility] None  01/06/2022  4:30 PM Myna Hidalgo, DO [facility]  01/09/2022 11:50 AM [facility]  01/13/2022  3:00 PM [facility] [place] [facility] None  01/13/2022  3:50 PM Lazaro Arms, MD [facility]  01/16/2022 11:50 AM [facility]  01/20/2022  2:15 PM [facility] [place] [facility] None  01/20/2022  3:10 PM Cheral Marker, CNM [facility]  01/23/2022 11:50 AM [facility]  01/27/2022  2:15 PM [facility] [place] [facility] None  01/27/2022  3:10 PM Eure, Amaryllis Dyke, MD [facility]    Orders Placed This Encounter  Procedures   POC Urinalysis Dipstick OB    Myna Hidalgo, DO Attending Obstetrician & Gynecologist, [facility] for [employer], [facility]

## 2021-12-16 NOTE — Progress Notes (Signed)
[place] 32+6 wks,cephalic,BPP 8/8,FHR 154 bpm,elevated UAD with EDF,RI .71,.69,.71,.74=91%,EFW 1720 g 7.3%,AC 8.7%,anterior placenta gr 2

## 2021-12-19 ENCOUNTER — Ambulatory Visit (INDEPENDENT_AMBULATORY_CARE_PROVIDER_SITE_OTHER): Payer: 59 | Admitting: *Deleted

## 2021-12-19 VITALS — BP 127/81 | HR 96 | Wt 302.4 lb

## 2021-12-19 DIAGNOSIS — O365990 Maternal care for other known or suspected poor fetal growth, unspecified trimester, not applicable or unspecified: Secondary | ICD-10-CM

## 2021-12-19 DIAGNOSIS — O288 Other abnormal findings on antenatal screening of mother: Secondary | ICD-10-CM

## 2021-12-19 DIAGNOSIS — Z331 Pregnant state, incidental: Secondary | ICD-10-CM

## 2021-12-19 DIAGNOSIS — I10 Essential (primary) hypertension: Secondary | ICD-10-CM | POA: Diagnosis not present

## 2021-12-19 DIAGNOSIS — O0990 Supervision of high risk pregnancy, unspecified, unspecified trimester: Secondary | ICD-10-CM | POA: Diagnosis not present

## 2021-12-19 DIAGNOSIS — Z3A33 33 weeks gestation of pregnancy: Secondary | ICD-10-CM

## 2021-12-19 DIAGNOSIS — Z1389 Encounter for screening for other disorder: Secondary | ICD-10-CM

## 2021-12-19 LAB — POCT URINALYSIS DIPSTICK OB
Blood, UA: NEGATIVE
Glucose, UA: NEGATIVE
Ketones, UA: NEGATIVE
Leukocytes, UA: NEGATIVE
Nitrite, UA: NEGATIVE
POC,PROTEIN,UA: NEGATIVE

## 2021-12-19 NOTE — Progress Notes (Signed)
   NURSE VISIT- NST  SUBJECTIVE:  Nichole Lang is a 31 y.o. G2P1001 female at [redacted]w[redacted]d, here for a NST for pregnancy complicated by [facility] and FGR.  She reports active fetal movement, contractions: none, vaginal bleeding: none, membranes: intact.   OBJECTIVE:  BP 127/81   Pulse 96   Wt (!) 302 lb 6.4 oz (137.2 kg)   LMP 04/30/2021   BMI 47.36 kg/m   Appears well, no apparent distress  No results found for this or any previous visit (from the past 24 hour(s)).  NST: FHR baseline 135 bpm, Variability: moderate, Accelerations:present, Decelerations:  Absent= Cat 1/reactive Toco: none   ASSESSMENT: G2P1001 at [redacted]w[redacted]d with CHTN and FGR NST reactive  PLAN: EFM strip reviewed by Dr. Charlotta Newton   Recommendations: keep next appointment as scheduled    Jobe Marker  12/19/2021 12:25 PM

## 2021-12-23 ENCOUNTER — Ambulatory Visit (INDEPENDENT_AMBULATORY_CARE_PROVIDER_SITE_OTHER): Payer: 59 | Admitting: Obstetrics & Gynecology

## 2021-12-23 ENCOUNTER — Other Ambulatory Visit: Payer: 59

## 2021-12-23 ENCOUNTER — Encounter: Payer: Self-pay | Admitting: Obstetrics & Gynecology

## 2021-12-23 VITALS — BP 117/71 | HR 102 | Wt 300.4 lb

## 2021-12-23 DIAGNOSIS — O0990 Supervision of high risk pregnancy, unspecified, unspecified trimester: Secondary | ICD-10-CM

## 2021-12-23 DIAGNOSIS — Z3A33 33 weeks gestation of pregnancy: Secondary | ICD-10-CM | POA: Diagnosis not present

## 2021-12-23 DIAGNOSIS — Z1389 Encounter for screening for other disorder: Secondary | ICD-10-CM

## 2021-12-23 DIAGNOSIS — Z331 Pregnant state, incidental: Secondary | ICD-10-CM

## 2021-12-23 LAB — POCT URINALYSIS DIPSTICK OB
Blood, UA: NEGATIVE
Glucose, UA: NEGATIVE
Ketones, UA: NEGATIVE
Leukocytes, UA: NEGATIVE
Nitrite, UA: NEGATIVE

## 2021-12-23 NOTE — Progress Notes (Signed)
HIGH-RISK PREGNANCY VISIT Patient name: Nichole Lang MRN [id number]  Date of birth: 1990/08/18 Chief Complaint:   Routine Prenatal Visit and High Risk Gestation (NST)  History of Present Illness:   Nichole Lang is a 31 y.o. G47P1001 female at [redacted]w[redacted]d with an Estimated Date of Delivery: 02/04/22 being seen today for ongoing management of a high-risk pregnancy complicated by chronic hypertension currently on labetalol 200 BID.    Today she reports no complaints. Contractions: Not present.  .  Movement: Present. denies leaking of fluid.      11/18/2021    9:57 AM 07/28/2021    2:40 PM  Depression screen PHQ 2/9  Decreased Interest 0 0  Down, Depressed, Hopeless 0 0  PHQ - 2 Score 0 0  Altered sleeping 0 0  Tired, decreased energy 1 1  Change in appetite 0 0  Feeling bad or failure about yourself  0 0  Trouble concentrating 0 0  Moving slowly or fidgety/restless 0 0  Suicidal thoughts 0 0  PHQ-9 Score 1 1        07/28/2021    2:40 PM  GAD 7 : Generalized Anxiety Score  Nervous, Anxious, on Edge 0  Control/stop worrying 0  Worry too much - different things 0  Trouble relaxing 0  Restless 0  Easily annoyed or irritable 0  Afraid - awful might happen 0  Total GAD 7 Score 0     Review of Systems:   Pertinent items are noted in HPI Denies abnormal vaginal discharge w/ itching/odor/irritation, headaches, visual changes, shortness of breath, chest pain, abdominal pain, severe nausea/vomiting, or problems with urination or bowel movements unless otherwise stated above. Pertinent History Reviewed:  Reviewed past medical,surgical, social, obstetrical and family history.  Reviewed problem list, medications and allergies. Physical Assessment:   Vitals:   12/23/21 1101  BP: 117/71  Pulse: (!) 102  Weight: (!) 300 lb 6.4 oz (136.3 kg)  Body mass index is 47.05 kg/m.           Physical Examination:   General appearance: alert, well appearing, and in no distress  Mental  status: alert, oriented to person, place, and time  Skin: warm & dry   Extremities: Edema: None    Cardiovascular: normal heart rate noted  Respiratory: normal respiratory effort, no distress  Abdomen: gravid, soft, non-tender  Pelvic: Cervical exam deferred         Fetal Status:     Movement: Present    Fetal Surveillance Testing today: Reactive NST Nichole Lang is at 102w6d Estimated Date of Delivery: 02/04/22  NST being performed due to [facility]  Today the NST is Reactive  Fetal Monitoring:  Baseline: 140 bpm, Variability: Good {> 6 bpm), Accelerations: Reactive, and Decelerations: Absent   reactive  The accelerations are >15 bpm and more than 2 in 20 minutes  Final diagnosis:  Reactive NST  Lazaro Arms, MD     Chaperone: N/A    Results for orders placed or performed in visit on 12/23/21 (from the past 24 hour(s))  POC Urinalysis Dipstick OB   Collection Time: 12/23/21 11:15 AM  Result Value Ref Range   Color, UA     Clarity, UA     Glucose, UA Negative Negative   Bilirubin, UA     Ketones, UA neg    Spec Grav, UA     Blood, UA neg    pH, UA     POC,PROTEIN,UA Trace Negative, Trace, Small (1+), Moderate (2+),  Large (3+), 4+   Urobilinogen, UA     Nitrite, UA neg    Leukocytes, UA Negative Negative   Appearance     Odor      Assessment & Plan:  High-risk pregnancy: G2P1001 at [redacted]w[redacted]d with an Estimated Date of Delivery: 02/04/22      ICD-10-CM   1. Supervision of high risk pregnancy, antepartum  O09.90     2. Pregnant state, incidental  Z33.1 POC Urinalysis Dipstick OB    3. Screening for genitourinary condition  Z13.89 POC Urinalysis Dipstick OB        Meds: No orders of the defined types were placed in this encounter.   Orders:  Orders Placed This Encounter  Procedures   POC Urinalysis Dipstick OB     Labs/procedures today: NST  Treatment Plan:  twice weekly surveillance  Reviewed: Preterm labor symptoms and general obstetric precautions  including but not limited to vaginal bleeding, contractions, leaking of fluid and fetal movement were reviewed in detail with the patient.  All questions were answered. Does have home bp cuff. Office bp cuff given: yes. Check bp daily, let us know if consistently >140 and/or >90.  Follow-up: Return for keep scheduled.   Future Appointments  Date Time Provider Department Center  12/26/2021 11:30 AM [facility] NURSE [facility]  12/30/2021  1:30 PM [facility] [place] [facility] None  12/30/2021  2:30 PM Cheral Marker, CNM [facility]  01/02/2022 11:10 AM [facility]  01/06/2022  3:45 PM [facility] [place] [facility] None  01/06/2022  4:30 PM Myna Hidalgo, DO [facility]  01/09/2022 11:50 AM [facility] N[facility]  01/13/2022  3:00 PM [facility] [place] [facility] None  01/13/2022  3:50 PM Lazaro Arms, MD [facility]  01/16/2022 11:50 AM [facility]  01/20/2022  2:15 PM [facility] [place] [facility] None  01/20/2022  3:10 PM Cheral Marker, CNM [facility]  01/23/2022 11:50 AM [facility]  01/27/2022  2:15 PM [facility] [place] [facility] None  01/27/2022  3:10 PM Britten Seyfried, Amaryllis Dyke, MD [facility]    Orders Placed This Encounter  Procedures   POC Urinalysis Dipstick OB   Lazaro Arms  Attending Physician for the Center for [facility] 12/23/2021 11:47 AM

## 2021-12-26 ENCOUNTER — Ambulatory Visit (INDEPENDENT_AMBULATORY_CARE_PROVIDER_SITE_OTHER): Payer: 59 | Admitting: *Deleted

## 2021-12-26 VITALS — BP 122/75 | HR 89 | Wt 300.8 lb

## 2021-12-26 DIAGNOSIS — D573 Sickle-cell trait: Secondary | ICD-10-CM

## 2021-12-26 DIAGNOSIS — Z6791 Unspecified blood type, Rh negative: Secondary | ICD-10-CM

## 2021-12-26 DIAGNOSIS — O0990 Supervision of high risk pregnancy, unspecified, unspecified trimester: Secondary | ICD-10-CM | POA: Diagnosis not present

## 2021-12-26 DIAGNOSIS — O288 Other abnormal findings on antenatal screening of mother: Secondary | ICD-10-CM

## 2021-12-26 DIAGNOSIS — I10 Essential (primary) hypertension: Secondary | ICD-10-CM

## 2021-12-26 DIAGNOSIS — Z331 Pregnant state, incidental: Secondary | ICD-10-CM

## 2021-12-26 DIAGNOSIS — O365990 Maternal care for other known or suspected poor fetal growth, unspecified trimester, not applicable or unspecified: Secondary | ICD-10-CM

## 2021-12-26 DIAGNOSIS — Z1389 Encounter for screening for other disorder: Secondary | ICD-10-CM

## 2021-12-26 LAB — POCT URINALYSIS DIPSTICK OB
Blood, UA: NEGATIVE
Glucose, UA: NEGATIVE
Ketones, UA: NEGATIVE
Leukocytes, UA: NEGATIVE
Nitrite, UA: NEGATIVE

## 2021-12-26 NOTE — Progress Notes (Signed)
   NURSE VISIT- NST  SUBJECTIVE:  Nichole Lang is a 31 y.o. G2P1001 female at [redacted]w[redacted]d, here for a NST for pregnancy complicated by [facility] and FGR.  She reports active fetal movement, contractions: none, vaginal bleeding: none, membranes: intact.   OBJECTIVE:  BP 122/75   Pulse 89   Wt (!) 300 lb 12.8 oz (136.4 kg)   LMP 04/30/2021   BMI 47.11 kg/m   Appears well, no apparent distress  Results for orders placed or performed in visit on 12/26/21 (from the past 24 hour(s))  POC Urinalysis Dipstick OB   Collection Time: 12/26/21 11:42 AM  Result Value Ref Range   Color, UA     Clarity, UA     Glucose, UA Negative Negative   Bilirubin, UA     Ketones, UA neg    Spec Grav, UA     Blood, UA neg    pH, UA     POC,PROTEIN,UA Trace Negative, Trace, Small (1+), Moderate (2+), Large (3+), 4+   Urobilinogen, UA     Nitrite, UA neg    Leukocytes, UA Negative Negative   Appearance     Odor      NST: FHR baseline 130 bpm, Variability: moderate, Accelerations:present, Decelerations:  Absent= Cat 1/reactive Toco: UI   ASSESSMENT: G2P1001 at [redacted]w[redacted]d with CHTN and FGR NST reactive  PLAN: EFM strip reviewed by Philipp Deputy, CNM   Recommendations: keep next appointment as scheduled    Jobe Marker  12/26/2021 12:23 PM

## 2021-12-29 ENCOUNTER — Other Ambulatory Visit: Payer: Self-pay | Admitting: Obstetrics & Gynecology

## 2021-12-29 DIAGNOSIS — O10919 Unspecified pre-existing hypertension complicating pregnancy, unspecified trimester: Secondary | ICD-10-CM

## 2021-12-30 ENCOUNTER — Ambulatory Visit (INDEPENDENT_AMBULATORY_CARE_PROVIDER_SITE_OTHER): Payer: 59

## 2021-12-30 ENCOUNTER — Ambulatory Visit (INDEPENDENT_AMBULATORY_CARE_PROVIDER_SITE_OTHER): Payer: 59 | Admitting: Women's Health

## 2021-12-30 ENCOUNTER — Encounter: Payer: Self-pay | Admitting: Women's Health

## 2021-12-30 VITALS — BP 126/85 | HR 98 | Wt 302.0 lb

## 2021-12-30 DIAGNOSIS — I10 Essential (primary) hypertension: Secondary | ICD-10-CM

## 2021-12-30 DIAGNOSIS — O0993 Supervision of high risk pregnancy, unspecified, third trimester: Secondary | ICD-10-CM

## 2021-12-30 DIAGNOSIS — O365990 Maternal care for other known or suspected poor fetal growth, unspecified trimester, not applicable or unspecified: Secondary | ICD-10-CM

## 2021-12-30 DIAGNOSIS — Z3A34 34 weeks gestation of pregnancy: Secondary | ICD-10-CM | POA: Diagnosis not present

## 2021-12-30 DIAGNOSIS — O0990 Supervision of high risk pregnancy, unspecified, unspecified trimester: Secondary | ICD-10-CM

## 2021-12-30 DIAGNOSIS — O10919 Unspecified pre-existing hypertension complicating pregnancy, unspecified trimester: Secondary | ICD-10-CM

## 2021-12-30 LAB — POCT URINALYSIS DIPSTICK OB
Blood, UA: NEGATIVE
Glucose, UA: NEGATIVE
Ketones, UA: NEGATIVE
Leukocytes, UA: NEGATIVE
Nitrite, UA: NEGATIVE

## 2021-12-30 NOTE — Progress Notes (Signed)
[place] 34+6 wks,cephalic,BPP 8/8,anterior placenta gr 3,FHR 137 bpm,AFI 12.2 cm,RI .70,.67,.68=88%

## 2021-12-30 NOTE — Progress Notes (Signed)
HIGH-RISK PREGNANCY VISIT Patient name: Nichole Lang MRN [id number]  Date of birth: 1991/04/27 Chief Complaint:   Routine Prenatal Visit  History of Present Illness:   Nichole Lang is a 31 y.o. G94P1001 female at [redacted]w[redacted]d with an Estimated Date of Delivery: 02/04/22 being seen today for ongoing management of a high-risk pregnancy complicated by chronic hypertension currently on labetalol 200mg  BID and fetal growth restriction 7.3% w/ initially elevated UAD.    Today she reports no complaints. Contractions: Not present. Vag. Bleeding: None.  Movement: Present. denies leaking of fluid.      11/18/2021    9:57 AM 07/28/2021    2:40 PM  Depression screen PHQ 2/9  Decreased Interest 0 0  Down, Depressed, Hopeless 0 0  PHQ - 2 Score 0 0  Altered sleeping 0 0  Tired, decreased energy 1 1  Change in appetite 0 0  Feeling bad or failure about yourself  0 0  Trouble concentrating 0 0  Moving slowly or fidgety/restless 0 0  Suicidal thoughts 0 0  PHQ-9 Score 1 1        07/28/2021    2:40 PM  GAD 7 : Generalized Anxiety Score  Nervous, Anxious, on Edge 0  Control/stop worrying 0  Worry too much - different things 0  Trouble relaxing 0  Restless 0  Easily annoyed or irritable 0  Afraid - awful might happen 0  Total GAD 7 Score 0     Review of Systems:   Pertinent items are noted in HPI Denies abnormal vaginal discharge w/ itching/odor/irritation, headaches, visual changes, shortness of breath, chest pain, abdominal pain, severe nausea/vomiting, or problems with urination or bowel movements unless otherwise stated above. Pertinent History Reviewed:  Reviewed past medical,surgical, social, obstetrical and family history.  Reviewed problem list, medications and allergies. Physical Assessment:   Vitals:   12/30/21 1431  BP: 126/85  Pulse: 98  Weight: (!) 302 lb (137 kg)  Body mass index is 47.3 kg/m.           Physical Examination:   General appearance: alert, well  appearing, and in no distress  Mental status: alert, oriented to person, place, and time  Skin: warm & dry   Extremities: Edema: None    Cardiovascular: normal heart rate noted  Respiratory: normal respiratory effort, no distress  Abdomen: gravid, soft, non-tender  Pelvic: Cervical exam deferred         Fetal Status: Fetal Heart Rate (bpm): 137 u/s   Movement: Present    Fetal Surveillance Testing today:  01/01/22 34+6 wks,cephalic,BPP 8/8,anterior placenta gr 3,FHR 137 bpm,AFI 12.2 cm,RI .70,.67,.68=88%  Chaperone: N/A    Results for orders placed or performed in visit on 12/30/21 (from the past 24 hour(s))  POC Urinalysis Dipstick OB   Collection Time: 12/30/21  2:34 PM  Result Value Ref Range   Color, UA     Clarity, UA     Glucose, UA Negative Negative   Bilirubin, UA     Ketones, UA neg    Spec Grav, UA     Blood, UA neg    pH, UA     POC,PROTEIN,UA Trace Negative, Trace, Small (1+), Moderate (2+), Large (3+), 4+   Urobilinogen, UA     Nitrite, UA neg    Leukocytes, UA Negative Negative   Appearance     Odor      Assessment & Plan:  High-risk pregnancy: G2P1001 at [redacted]w[redacted]d with an Estimated Date of Delivery: 02/04/22   1)  CHTN, stable on labetalol 200mg  BID, ASA, reviewed pre-e s/s, reasons to seek care  2) FGR 7.3%, dx @ 32.6wk, initially elevated UAD, high normal today 88%, good EDF  3) Wants BTL, previously signed papers 8/8, invalid, resigned today. Discussed won't be 30d by IOL, so would be interval, thinks she may possibly change mind and do nexplanon  Meds: No orders of the defined types were placed in this encounter.   Labs/procedures today: U/S  Treatment Plan:  2x/wk testing, EFW q 3wks, IOL 37-38wks  Reviewed: Preterm labor symptoms and general obstetric precautions including but not limited to vaginal bleeding, contractions, leaking of fluid and fetal movement were reviewed in detail with the patient.  All questions were answered. Does have home bp cuff.  Office bp cuff given: not applicable. Check bp daily, let 10/8 know if consistently >140 and/or >90.  Follow-up: Return for As scheduled.   Future Appointments  Date Time Provider Department Center  01/02/2022 11:10 AM [facility] NURSE [facility]  01/06/2022  3:45 PM [facility] 01/08/2022 [facility] None  01/06/2022  4:30 PM 01/08/2022, DO [facility]  01/09/2022 11:50 AM [facility] NUR[facility]  01/13/2022  3:00 PM [facility] 03/15/2022 [facility] None  01/13/2022  3:50 PM 03/15/2022, MD [facility]  01/16/2022 11:50 AM [facility] NURSE [facility]  01/20/2022  2:15 PM [facility] 03/22/2022 [facility] None  01/20/2022  3:10 PM 03/22/2022, CNM [facility]  01/23/2022 11:50 AM [facility] NURSE [facility]  01/27/2022  2:15 PM [facility] 01/29/2022 [facility] None  01/27/2022  3:10 PM Eure, 01/29/2022, MD [facility]    Orders Placed This Encounter  Procedures   POC Urinalysis Dipstick OB   Amaryllis Dyke CNM, [facility] 12/30/2021 2:59 PM

## 2021-12-30 NOTE — Patient Instructions (Signed)
Dominique, thank you for choosing our office today! We appreciate the opportunity to meet your healthcare needs. You may receive a short survey by mail, e-mail, or through MyChart. If you are happy with your care we would appreciate if you could take just a few minutes to complete the survey questions. We read all of your comments and take your feedback very seriously. Thank you again for choosing our office.  [facility] Team at [facility] at  ([address] [place], [place] [address]) Entrance C, located off of [address] Free 24/7 valet parking   CLASSES: Go to [email or web address] to register for classes (childbirth, breastfeeding, waterbirth, infant CPR, daddy bootcamp, etc.)  Call the office ([phone]) or go to [facility] if: You begin to have strong, frequent contractions Your water breaks.  Sometimes it is a big gush of fluid, sometimes it is just a trickle that keeps getting your panties wet or running down your legs You have vaginal bleeding.  It is normal to have a small amount of spotting if your cervix was checked.  You don't feel your baby moving like normal.  If you don't, get you something to eat and drink and lay down and focus on feeling your baby move.   If your baby is still not moving like normal, you should call the office or go to [facility].  Call the office ([phone]) or go to [facility] for these signs of pre-eclampsia: Severe headache that does not go away with Tylenol Visual changes- seeing spots, double, blurred vision Pain under your right breast or upper abdomen that does not go away with Tums or heartburn medicine Nausea and/or vomiting Severe swelling in your hands, feet, and face   Tdap Vaccine It is recommended that you get the Tdap vaccine during the third trimester of EACH pregnancy to help protect your baby from getting pertussis (whooping cough) 27-36 weeks is the BEST time to do  this so that you can pass the protection on to your baby. During pregnancy is better than after pregnancy, but if you are unable to get it during pregnancy it will be offered at the hospital.  You can get this vaccine with us, at the health department, your family doctor, or some local pharmacies Everyone who will be around your baby should also be up-to-date on their vaccines before the baby comes. Adults (who are not pregnant) only need 1 dose of Tdap during adulthood.   [place] Pediatricians/Family Doctors [facility] ([facility]): [address], [phone]           [facility]: [address], [phone]                [facility] ([facility]): [address], [phone] (call to ask if accepting patients) [facility]: [address], [place], [phone]    [place] Pediatricians/Family Doctors [facility] ([facility]): [address], [phone] [facility]: [address], [phone] [facility]: [address], [phone]  [place] [facility]  [facility] ([facility]): [phone] [facility]: [address], [phone]   [place] [facility]: [address], [phone]  [place] [facility]  [facility]: [address], [phone]  Home Blood Pressure Monitoring for Patients   Your provider has recommended that you check your   blood pressure (BP) at least once a week at home. If you do not have a blood pressure cuff at home, one will be provided for you. Contact your provider if you have not received your monitor within 1 week.   Helpful Tips for Accurate Home Blood Pressure Checks  Don't smoke, exercise, or drink caffeine 30 minutes before checking your BP Use the restroom before checking your BP (a full bladder can raise your  pressure) Relax in a comfortable upright chair Feet on the ground Left arm resting comfortably on a flat surface at the level of your heart Legs uncrossed Back supported Sit quietly and don't talk Place the cuff on your bare arm Adjust snuggly, so that only two fingertips can fit between your skin and the top of the cuff Check 2 readings separated by at least one minute Keep a log of your BP readings For a visual, please reference this diagram: http://ccnc.care/bpdiagram  Provider Name: [facility]     Phone: [phone]  Zone 1: ALL CLEAR  Continue to monitor your symptoms:  BP reading is less than 140 (top number) or less than 90 (bottom number)  No right upper stomach pain No headaches or seeing spots No feeling nauseated or throwing up No swelling in face and hands  Zone 2: CAUTION Call your doctor's office for any of the following:  BP reading is greater than 140 (top number) or greater than 90 (bottom number)  Stomach pain under your ribs in the middle or right side Headaches or seeing spots Feeling nauseated or throwing up Swelling in face and hands  Zone 3: EMERGENCY  Seek immediate medical care if you have any of the following:  BP reading is greater than160 (top number) or greater than 110 (bottom number) Severe headaches not improving with Tylenol Serious difficulty catching your breath Any worsening symptoms from Zone 2  Preterm Labor and Birth Information  The normal length of a pregnancy is 39-41 weeks. Preterm labor is when labor starts before 37 completed weeks of pregnancy. What are the risk factors for preterm labor? Preterm labor is more likely to occur in women who: Have certain infections during pregnancy such as a bladder infection, sexually transmitted infection, or infection inside the uterus (chorioamnionitis). Have a shorter-than-normal cervix. Have gone into preterm labor before. Have had surgery on their cervix. Are younger than age 17  or older than age 35. Are African American. Are pregnant with twins or multiple babies (multiple gestation). Take street drugs or smoke while pregnant. Do not gain enough weight while pregnant. Became pregnant shortly after having been pregnant. What are the symptoms of preterm labor? Symptoms of preterm labor include: Cramps similar to those that can happen during a menstrual period. The cramps may happen with diarrhea. Pain in the abdomen or lower back. Regular uterine contractions that may feel like tightening of the abdomen. A feeling of increased pressure in the pelvis. Increased watery or bloody mucus discharge from the vagina. Water breaking (ruptured amniotic sac). Why is it important to recognize signs of preterm labor? It is important to recognize signs of preterm labor because babies who are born prematurely may not be fully developed. This can put them at an increased risk for: Long-term (chronic) heart and lung problems. Difficulty immediately after birth with regulating body systems, including blood sugar, body temperature, heart rate, and breathing rate. Bleeding in the brain. Cerebral palsy. Learning difficulties. Death. These risks are highest for babies who are born before 34 weeks   of pregnancy. How is preterm labor treated? Treatment depends on the length of your pregnancy, your condition, and the health of your baby. It may involve: Having a stitch (suture) placed in your cervix to prevent your cervix from opening too early (cerclage). Taking or being given medicines, such as: Hormone medicines. These may be given early in pregnancy to help support the pregnancy. Medicine to stop contractions. Medicines to help mature the baby's lungs. These may be prescribed if the risk of delivery is high. Medicines to prevent your baby from developing cerebral palsy. If the labor happens before 34 weeks of pregnancy, you may need to stay in the hospital. What should I do if I  think I am in preterm labor? If you think that you are going into preterm labor, call your health care provider right away. How can I prevent preterm labor in future pregnancies? To increase your chance of having a full-term pregnancy: Do not use any tobacco products, such as cigarettes, chewing tobacco, and e-cigarettes. If you need help quitting, ask your health care provider. Do not use street drugs or medicines that have not been prescribed to you during your pregnancy. Talk with your health care provider before taking any herbal supplements, even if you have been taking them regularly. Make sure you gain a healthy amount of weight during your pregnancy. Watch for infection. If you think that you might have an infection, get it checked right away. Make sure to tell your health care provider if you have gone into preterm labor before. This information is not intended to replace advice given to you by your health care provider. Make sure you discuss any questions you have with your health care provider. Document Revised: 08/19/2018 Document Reviewed: 09/18/2015 Elsevier Patient Education  2020 Elsevier Inc.   

## 2022-01-02 ENCOUNTER — Ambulatory Visit (INDEPENDENT_AMBULATORY_CARE_PROVIDER_SITE_OTHER): Payer: 59 | Admitting: *Deleted

## 2022-01-02 VITALS — BP 128/80 | HR 97 | Wt 300.6 lb

## 2022-01-02 DIAGNOSIS — I10 Essential (primary) hypertension: Secondary | ICD-10-CM | POA: Diagnosis not present

## 2022-01-02 DIAGNOSIS — O0990 Supervision of high risk pregnancy, unspecified, unspecified trimester: Secondary | ICD-10-CM

## 2022-01-02 DIAGNOSIS — Z3A35 35 weeks gestation of pregnancy: Secondary | ICD-10-CM | POA: Diagnosis not present

## 2022-01-02 DIAGNOSIS — O365990 Maternal care for other known or suspected poor fetal growth, unspecified trimester, not applicable or unspecified: Secondary | ICD-10-CM | POA: Diagnosis not present

## 2022-01-02 DIAGNOSIS — O26899 Other specified pregnancy related conditions, unspecified trimester: Secondary | ICD-10-CM

## 2022-01-02 DIAGNOSIS — D573 Sickle-cell trait: Secondary | ICD-10-CM

## 2022-01-02 NOTE — Progress Notes (Signed)
   NURSE VISIT- NST  SUBJECTIVE:  Nichole Lang is a 31 y.o. G2P1001 female at [redacted]w[redacted]d, here for a NST for pregnancy complicated by [facility] and FGR.  She reports active fetal movement, contractions: none, vaginal bleeding: none, membranes: intact.   OBJECTIVE:  BP 128/80   Pulse 97   Wt (!) 300 lb 9.6 oz (136.4 kg)   LMP 04/30/2021   BMI 47.08 kg/m   Appears well, no apparent distress  No results found for this or any previous visit (from the past 24 hour(s)).  NST: FHR baseline 135 bpm, Variability: moderate, Accelerations:present, Decelerations:  Absent= Cat 1/reactive Toco: none   ASSESSMENT: G2P1001 at [redacted]w[redacted]d with CHTN and FGR NST reactive  PLAN: EFM strip reviewed by Dr. Charlotta Newton   Recommendations: keep next appointment as scheduled    Jobe Marker  01/02/2022 12:51 PM

## 2022-01-05 ENCOUNTER — Other Ambulatory Visit: Payer: Self-pay | Admitting: Obstetrics & Gynecology

## 2022-01-05 DIAGNOSIS — O10919 Unspecified pre-existing hypertension complicating pregnancy, unspecified trimester: Secondary | ICD-10-CM

## 2022-01-06 ENCOUNTER — Ambulatory Visit (INDEPENDENT_AMBULATORY_CARE_PROVIDER_SITE_OTHER): Payer: 59

## 2022-01-06 ENCOUNTER — Encounter: Payer: 59 | Admitting: Women's Health

## 2022-01-06 ENCOUNTER — Encounter: Payer: 59 | Admitting: Obstetrics & Gynecology

## 2022-01-06 DIAGNOSIS — O0990 Supervision of high risk pregnancy, unspecified, unspecified trimester: Secondary | ICD-10-CM

## 2022-01-06 DIAGNOSIS — Z6791 Unspecified blood type, Rh negative: Secondary | ICD-10-CM

## 2022-01-06 DIAGNOSIS — O10919 Unspecified pre-existing hypertension complicating pregnancy, unspecified trimester: Secondary | ICD-10-CM | POA: Diagnosis not present

## 2022-01-06 DIAGNOSIS — D573 Sickle-cell trait: Secondary | ICD-10-CM

## 2022-01-06 DIAGNOSIS — Z3A35 35 weeks gestation of pregnancy: Secondary | ICD-10-CM

## 2022-01-06 NOTE — Progress Notes (Signed)
[place] 35+6 wks,cephalic,BPP 8/8,FHR 148 bpm,AFI 20.5 cm,anterior placenta gr 3,RI .70,.66,.65,.62=80%

## 2022-01-07 ENCOUNTER — Encounter: Payer: Self-pay | Admitting: *Deleted

## 2022-01-07 ENCOUNTER — Other Ambulatory Visit: Payer: Self-pay | Admitting: Obstetrics & Gynecology

## 2022-01-07 DIAGNOSIS — O10919 Unspecified pre-existing hypertension complicating pregnancy, unspecified trimester: Secondary | ICD-10-CM

## 2022-01-07 DIAGNOSIS — O365931 Maternal care for other known or suspected poor fetal growth, third trimester, fetus 1: Secondary | ICD-10-CM

## 2022-01-08 ENCOUNTER — Ambulatory Visit (INDEPENDENT_AMBULATORY_CARE_PROVIDER_SITE_OTHER): Payer: 59 | Admitting: Obstetrics & Gynecology

## 2022-01-08 ENCOUNTER — Encounter: Payer: Self-pay | Admitting: Obstetrics & Gynecology

## 2022-01-08 ENCOUNTER — Other Ambulatory Visit (HOSPITAL_COMMUNITY)
Admission: RE | Admit: 2022-01-08 | Discharge: 2022-01-08 | Disposition: A | Discharge status: Home / Self Care | Payer: 59 | Source: Ambulatory Visit | Attending: Obstetrics & Gynecology | Admitting: Obstetrics & Gynecology

## 2022-01-08 VITALS — BP 126/82 | HR 94 | Wt 303.0 lb

## 2022-01-08 DIAGNOSIS — O365990 Maternal care for other known or suspected poor fetal growth, unspecified trimester, not applicable or unspecified: Secondary | ICD-10-CM

## 2022-01-08 DIAGNOSIS — Z3A36 36 weeks gestation of pregnancy: Secondary | ICD-10-CM

## 2022-01-08 DIAGNOSIS — O0993 Supervision of high risk pregnancy, unspecified, third trimester: Secondary | ICD-10-CM | POA: Diagnosis present

## 2022-01-08 LAB — POCT URINALYSIS DIPSTICK OB
Blood, UA: NEGATIVE
Glucose, UA: NEGATIVE
Ketones, UA: NEGATIVE
Leukocytes, UA: NEGATIVE
Nitrite, UA: NEGATIVE
POC,PROTEIN,UA: NEGATIVE

## 2022-01-08 NOTE — Progress Notes (Signed)
HIGH-RISK PREGNANCY VISIT Patient name: Nichole Lang MRN [id number]  Date of birth: April 20, 1991 Chief Complaint:   Routine Prenatal Visit  History of Present Illness:   Nichole Lang is a 31 y.o. G75P1001 female at [redacted]w[redacted]d with an Estimated Date of Delivery: 02/04/22 being seen today for ongoing management of a high-risk pregnancy complicated by  -FGR -obesity  Today she reports no complaints.   Contractions: Not present. Vag. Bleeding: None.  Movement: Present. denies leaking of fluid.      11/18/2021    9:57 AM 07/28/2021    2:40 PM  Depression screen PHQ 2/9  Decreased Interest 0 0  Down, Depressed, Hopeless 0 0  PHQ - 2 Score 0 0  Altered sleeping 0 0  Tired, decreased energy 1 1  Change in appetite 0 0  Feeling bad or failure about yourself  0 0  Trouble concentrating 0 0  Moving slowly or fidgety/restless 0 0  Suicidal thoughts 0 0  PHQ-9 Score 1 1     Current Outpatient Medications  Medication Instructions   aspirin EC 162 mg, Oral, Daily   Fexofenadine HCl (ALLEGRA PO) Take by mouth.   labetalol (NORMODYNE) 200 mg, Oral, 2 times daily   Prenat w/o A Vit-FeFum-FePo-FA (PROVIDA OB PO) 1 tablet, Oral, Daily     Review of Systems:   Pertinent items are noted in HPI Denies abnormal vaginal discharge w/ itching/odor/irritation, headaches, visual changes, shortness of breath, chest pain, abdominal pain, severe nausea/vomiting, or problems with urination or bowel movements unless otherwise stated above. Pertinent History Reviewed:  Reviewed past medical,surgical, social, obstetrical and family history.  Reviewed problem list, medications and allergies. Physical Assessment:   Vitals:   01/08/22 1341  BP: 126/82  Pulse: 94  Weight: (!) 303 lb (137.4 kg)  Body mass index is 47.46 kg/m.           Physical Examination:   General appearance: alert, well appearing, and in no distress  Mental status: normal mood, behavior, speech, dress, motor activity, and  thought processes  Skin: warm & dry   Extremities: Edema: None    Cardiovascular: normal heart rate noted  Respiratory: normal respiratory effort, no distress  Abdomen: gravid, soft, non-tender  Pelvic: Cervical exam performed  Dilation: Closed Effacement (%): Thick Station: -3  Fetal Status: Fetal Heart Rate (bpm): 140 Fundal Height: 34 cm Movement: Present    Fetal Surveillance Testing today: none, NST rescheduled for tomorrow   Chaperone:  pt declined     No results found for this or any previous visit (from the past 24 hour(s)).   Assessment & Plan:  High-risk pregnancy: G2P1001 at [redacted]w[redacted]d with an Estimated Date of Delivery: 02/04/22   1) FGR -last US/doppler 8/29- normal dopplers -continue twice weekly testing- NST to be done tomorrow -plan for IOL @ 38wks  Meds: No orders of the defined types were placed in this encounter.   Labs/procedures today: GBS, GC/C collected today  Treatment Plan:  as outlined above  Reviewed: Preterm labor symptoms and general obstetric precautions including but not limited to vaginal bleeding, contractions, leaking of fluid and fetal movement were reviewed in detail with the patient.  All questions were answered. Pt has home bp cuff. Check bp weekly, let us know if >140/90.   Follow-up: Return for twice weekly as scheduled.   Future Appointments  Date Time Provider Department Center  01/09/2022 12:10 PM [facility] NURSE [facility]  01/13/2022  3:00 PM [facility] [place] [facility] None  01/13/2022  3:50 PM Lazaro Arms, MD [facility]  01/16/2022 11:50 AM [facility] NURSE [facility]  01/20/2022  2:15 PM [facility] [place] [facility] None  01/20/2022  3:10 PM Cheral Marker, CNM [facility]  01/23/2022 11:50 AM [facility] NURSE [facility]  01/27/2022  2:15 PM [facility] [place] [facility] None  01/27/2022  3:10 PM Eure, Amaryllis Dyke, MD [facility]    Orders Placed This Encounter  Procedures   Culture, beta strep (group b only)     Myna Hidalgo, DO Attending Obstetrician & Gynecologist, [facility] [employer], [facility]

## 2022-01-08 NOTE — Addendum Note (Signed)
Addended by: Annamarie Dawley on: 01/08/2022 02:28 PM   Modules accepted: Orders

## 2022-01-09 ENCOUNTER — Ambulatory Visit (INDEPENDENT_AMBULATORY_CARE_PROVIDER_SITE_OTHER): Payer: Medicaid Other | Admitting: *Deleted

## 2022-01-09 ENCOUNTER — Encounter (HOSPITAL_COMMUNITY): Payer: Self-pay

## 2022-01-09 ENCOUNTER — Telehealth (HOSPITAL_COMMUNITY): Payer: Self-pay | Admitting: *Deleted

## 2022-01-09 ENCOUNTER — Other Ambulatory Visit: Payer: 59

## 2022-01-09 VITALS — BP 128/85 | HR 87 | Wt 302.6 lb

## 2022-01-09 DIAGNOSIS — I10 Essential (primary) hypertension: Secondary | ICD-10-CM | POA: Diagnosis not present

## 2022-01-09 DIAGNOSIS — Z3A36 36 weeks gestation of pregnancy: Secondary | ICD-10-CM

## 2022-01-09 DIAGNOSIS — O365990 Maternal care for other known or suspected poor fetal growth, unspecified trimester, not applicable or unspecified: Secondary | ICD-10-CM | POA: Diagnosis not present

## 2022-01-09 DIAGNOSIS — O0990 Supervision of high risk pregnancy, unspecified, unspecified trimester: Secondary | ICD-10-CM | POA: Diagnosis not present

## 2022-01-09 NOTE — Progress Notes (Signed)
   NURSE VISIT- NST  SUBJECTIVE:  Nichole Lang is a 31 y.o. G2P1001 female at [redacted]w[redacted]d, here for a NST for pregnancy complicated by [facility] and FGR.  She reports active fetal movement, contractions: none, vaginal bleeding: none, membranes: intact.   OBJECTIVE:  BP 128/85   Pulse 87   Wt (!) 302 lb 9.6 oz (137.3 kg)   LMP 04/30/2021   BMI 47.39 kg/m   Appears well, no apparent distress  Results for orders placed or performed in visit on 01/08/22 (from the past 24 hour(s))  POC Urinalysis Dipstick OB   Collection Time: 01/08/22  2:26 PM  Result Value Ref Range   Color, UA     Clarity, UA     Glucose, UA Negative Negative   Bilirubin, UA     Ketones, UA neg    Spec Grav, UA     Blood, UA neg    pH, UA     POC,PROTEIN,UA Negative Negative, Trace, Small (1+), Moderate (2+), Large (3+), 4+   Urobilinogen, UA     Nitrite, UA neg    Leukocytes, UA Negative Negative   Appearance     Odor      NST: FHR baseline 140 bpm, Variability: moderate, Accelerations:present, Decelerations:  Absent= Cat 1/reactive Toco: none   ASSESSMENT: G2P1001 at [redacted]w[redacted]d with CHTN and FGR NST reactive  PLAN: EFM strip reviewed by Dr. Charlotta Newton   Recommendations: keep next appointment as scheduled    Nichole Lang  01/09/2022 1:10 PM

## 2022-01-09 NOTE — Telephone Encounter (Signed)
Preadmission screen  

## 2022-01-10 ENCOUNTER — Other Ambulatory Visit: Payer: Self-pay | Admitting: Advanced Practice Midwife

## 2022-01-10 DIAGNOSIS — O365990 Maternal care for other known or suspected poor fetal growth, unspecified trimester, not applicable or unspecified: Secondary | ICD-10-CM

## 2022-01-12 LAB — CULTURE, BETA STREP (GROUP B ONLY): Strep Gp B Culture: NEGATIVE

## 2022-01-13 ENCOUNTER — Ambulatory Visit (INDEPENDENT_AMBULATORY_CARE_PROVIDER_SITE_OTHER): Payer: Medicaid Other

## 2022-01-13 ENCOUNTER — Telehealth (HOSPITAL_COMMUNITY): Payer: Self-pay | Admitting: *Deleted

## 2022-01-13 ENCOUNTER — Ambulatory Visit (INDEPENDENT_AMBULATORY_CARE_PROVIDER_SITE_OTHER): Payer: Medicaid Other | Admitting: Obstetrics and Gynecology

## 2022-01-13 ENCOUNTER — Encounter: Payer: Self-pay | Admitting: Obstetrics and Gynecology

## 2022-01-13 ENCOUNTER — Encounter (HOSPITAL_COMMUNITY): Payer: Self-pay | Admitting: *Deleted

## 2022-01-13 VITALS — BP 126/86 | HR 94 | Wt 304.0 lb

## 2022-01-13 DIAGNOSIS — O0990 Supervision of high risk pregnancy, unspecified, unspecified trimester: Secondary | ICD-10-CM

## 2022-01-13 DIAGNOSIS — Z3A36 36 weeks gestation of pregnancy: Secondary | ICD-10-CM

## 2022-01-13 DIAGNOSIS — D573 Sickle-cell trait: Secondary | ICD-10-CM

## 2022-01-13 DIAGNOSIS — O10919 Unspecified pre-existing hypertension complicating pregnancy, unspecified trimester: Secondary | ICD-10-CM | POA: Insufficient documentation

## 2022-01-13 DIAGNOSIS — Z6791 Unspecified blood type, Rh negative: Secondary | ICD-10-CM

## 2022-01-13 DIAGNOSIS — O26899 Other specified pregnancy related conditions, unspecified trimester: Secondary | ICD-10-CM

## 2022-01-13 DIAGNOSIS — O365931 Maternal care for other known or suspected poor fetal growth, third trimester, fetus 1: Secondary | ICD-10-CM

## 2022-01-13 DIAGNOSIS — O365990 Maternal care for other known or suspected poor fetal growth, unspecified trimester, not applicable or unspecified: Secondary | ICD-10-CM

## 2022-01-13 DIAGNOSIS — Z3009 Encounter for other general counseling and advice on contraception: Secondary | ICD-10-CM | POA: Insufficient documentation

## 2022-01-13 LAB — POCT URINALYSIS DIPSTICK OB
Blood, UA: NEGATIVE
Glucose, UA: NEGATIVE
Ketones, UA: NEGATIVE
Leukocytes, UA: NEGATIVE
Nitrite, UA: NEGATIVE
POC,PROTEIN,UA: NEGATIVE

## 2022-01-13 LAB — CERVICOVAGINAL ANCILLARY ONLY
Chlamydia: NEGATIVE
Comment: NEGATIVE
Comment: NORMAL
Neisseria Gonorrhea: NEGATIVE

## 2022-01-13 NOTE — Progress Notes (Signed)
Subjective:  Nichole Lang is a 31 y.o. G2P1001 at [redacted]w[redacted]d being seen today for ongoing prenatal care.  She is currently monitored for the following issues for this high-risk pregnancy and has Hypertension; Supervision of high risk pregnancy, antepartum; Rh negative state in antepartum period; History of gestational hypertension; Obesity affecting pregnancy; Sickle cell trait (HCC); Prediabetes; Fetal growth restriction antepartum; Chronic hypertension affecting pregnancy; and Unwanted fertility on their problem list.  Patient reports general discomforts of pregnancy.  Contractions: Not present. Vag. Bleeding: None.  Movement: Present. Denies leaking of fluid.   The following portions of the patient's history were reviewed and updated as appropriate: allergies, current medications, past family history, past medical history, past social history, past surgical history and problem list. Problem list updated.  Objective:   Vitals:   01/13/22 1557  BP: 126/86  Pulse: 94  Weight: (!) 304 lb (137.9 kg)    Fetal Status:     Movement: Present     General:  Alert, oriented and cooperative. Patient is in no acute distress.  Skin: Skin is warm and dry. No rash noted.   Cardiovascular: Normal heart rate noted  Respiratory: Normal respiratory effort, no problems with respiration noted  Abdomen: Soft, gravid, appropriate for gestational age. Pain/Pressure: Absent     Pelvic:  Cervical exam deferred        Extremities: Normal range of motion.  Edema: Mild pitting, slight indentation  Mental Status: Normal mood and affect. Normal behavior. Normal judgment and thought content.   Urinalysis:      Assessment and Plan:  Pregnancy: G2P1001 at [redacted]w[redacted]d  1. Supervision of high risk pregnancy, antepartum Stable Labor precautions - POC Urinalysis Dipstick OB  2. Fetal growth restriction antepartum Growth, BPP and doppler studies today Continue with antenatal testing  IOL scheduled for 01/21/22  3. Rh  negative state in antepartum period S/P Rhogam  4. Chronic hypertension affecting pregnancy Stable Growth scan and antenatal testing as noted above IOL scheduled Continue with current management - POC Urinalysis Dipstick OB  5. Unwanted fertility BTL papers signed  Term labor symptoms and general obstetric precautions including but not limited to vaginal bleeding, contractions, leaking of fluid and fetal movement were reviewed in detail with the patient. Please refer to After Visit Summary for other counseling recommendations.  Return in about 1 week (around 01/20/2022) for OB visit, face to face, any provider.   Hermina Staggers, MD

## 2022-01-13 NOTE — Progress Notes (Signed)
[place] 36+6 wks,cephalic,BPP 8/8,anterior placenta gr 3,AFI 21 cm,FHR 130 bpm,RI .65,.64,.66,.67=87%,EFW 2579 g 14.7%

## 2022-01-13 NOTE — Telephone Encounter (Signed)
Preadmission screen  

## 2022-01-13 NOTE — Patient Instructions (Signed)
Vaginal Delivery  Vaginal delivery means that you give birth by pushing your baby out of your birth canal (vagina). Your health care team will help you before, during, and after vaginal delivery. Birth experiences are unique for every woman and every pregnancy, and birth experiences vary depending on where you choose to give birth. What are the risks and benefits? Generally, this is safe. However, problems may occur, including: Bleeding. Infection. Damage to other structures such as vaginal tearing. Allergic reactions to medicines. Despite the risks, benefits of vaginal delivery include less risk of bleeding and infection and a shorter recovery time compared to a Cesarean delivery. Cesarean delivery, or C-section, is the surgical delivery of a baby. What happens when I arrive at the birth center or hospital? Once you are in labor and have been admitted into the hospital or birth center, your health care team may: Review your pregnancy history and any concerns that you have. Talk with you about your birth plan and discuss pain control options. Check your blood pressure, breathing, and heartbeat. Assess your baby's heartbeat. Monitor your uterus for contractions. Check whether your bag of water (amniotic sac) has broken (ruptured). Insert an IV into one of your veins. This may be used to give you fluids and medicines. Monitoring Your health care team may assess your contractions (uterine monitoring) and your baby's heart rate (fetal monitoring). You may need to be monitored: Often, but not continuously (intermittently). All the time or for long periods at a time (continuously). Continuous monitoring may be needed if: You are taking certain medicines, such as medicine to relieve pain or make your contractions stronger. You have pregnancy or labor complications. Monitoring may be done by: Placing a special stethoscope or a handheld monitoring device on your abdomen to check your baby's  heartbeat and to check for contractions. Placing monitors on your abdomen (external monitors) to record your baby's heartbeat and the frequency and length of contractions. Placing monitors inside your uterus through your vagina (internal monitors) to record your baby's heartbeat and the frequency, length, and strength of your contractions. Depending on the type of monitor, it may remain in your uterus or on your baby's head until birth. Telemetry. This is a type of continuous monitoring that can be done with external or internal monitors. Instead of having to stay in bed, you are able to move around. Physical exam Your health care team may perform frequent physical exams. This may include: Checking how and where your baby is positioned in your uterus. Checking your cervix to determine: Whether it is thinning out (effacing). Whether it is opening up (dilating). What happens during labor and delivery?  Normal labor and delivery is divided into the following three stages: Stage 1 This is the longest stage of labor. Throughout this stage, you will feel contractions. Contractions generally feel mild, infrequent, and irregular at first. They get stronger, more frequent, and more regular as you move through this stage. You may have contractions about every 2-3 minutes. This stage ends when your cervix is completely dilated to 4 inches (10 cm) and completely effaced. Stage 2 This stage starts once your cervix is completely effaced and dilated and lasts until the delivery of your baby. This is the stage where you will feel an urge to push your baby out of your vagina. You may feel stretching and burning pain, especially when the widest part of your baby's head passes through the vaginal opening (crowning). Once your baby is delivered, the umbilical cord will be   clamped and cut. Timing of cutting the cord will depend on your wishes, your baby's health, and your health care provider's practices. Your baby  will be placed on your bare chest (skin-to-skin contact) in an upright position and covered with a warm blanket. If you are choosing to breastfeed, watch your baby for feeding cues, like rooting or sucking, and help the baby to your breast for his or her first feeding. Stage 3 This stage starts immediately after the birth of your baby and ends after you deliver the placenta. This stage may take anywhere from 5 to 30 minutes. After your baby has been delivered, you will feel contractions as your body expels the placenta. These contractions also help your uterus get smaller and reduce bleeding. What can I expect after labor and delivery? After labor is over, you and your baby will be assessed closely until you are ready to go home. Your health care team will teach you how to care for yourself and your baby. You and your baby may be encouraged to stay in the same room (rooming in) during your hospital stay. This will help promote early bonding and successful breastfeeding. Your uterus will be checked and massaged regularly (fundal massage). You may continue to receive fluids and medicines through an IV. You will have some soreness and pain in your abdomen, vagina, and the area of skin between your vaginal opening and your anus (perineum). If an incision was made near your vagina (episiotomy) or if you had some vaginal tearing during delivery, cold compresses may be placed on your episiotomy or your tear. This helps to reduce pain and swelling. It is normal to have vaginal bleeding after delivery. Wear a sanitary pad for vaginal bleeding and discharge. Summary Vaginal delivery means that you will give birth by pushing your baby out of your birth canal (vagina). Your health care team will monitor you and your baby throughout the stages of labor. After you deliver your baby, your health care team will continue to assess you and your baby to ensure you are both recovering as expected after delivery. This  information is not intended to replace advice given to you by your health care provider. Make sure you discuss any questions you have with your health care provider. Document Revised: 03/25/2020 Document Reviewed: 03/25/2020 Elsevier Patient Education  2023 [employer]  

## 2022-01-16 ENCOUNTER — Ambulatory Visit (INDEPENDENT_AMBULATORY_CARE_PROVIDER_SITE_OTHER): Payer: Medicaid Other | Admitting: *Deleted

## 2022-01-16 VITALS — BP 140/86 | HR 94 | Wt 302.4 lb

## 2022-01-16 DIAGNOSIS — O0990 Supervision of high risk pregnancy, unspecified, unspecified trimester: Secondary | ICD-10-CM

## 2022-01-16 DIAGNOSIS — O10919 Unspecified pre-existing hypertension complicating pregnancy, unspecified trimester: Secondary | ICD-10-CM

## 2022-01-16 DIAGNOSIS — O288 Other abnormal findings on antenatal screening of mother: Secondary | ICD-10-CM | POA: Diagnosis not present

## 2022-01-16 DIAGNOSIS — Z3A37 37 weeks gestation of pregnancy: Secondary | ICD-10-CM | POA: Diagnosis not present

## 2022-01-16 DIAGNOSIS — Z1389 Encounter for screening for other disorder: Secondary | ICD-10-CM

## 2022-01-16 DIAGNOSIS — Z331 Pregnant state, incidental: Secondary | ICD-10-CM

## 2022-01-16 LAB — POCT URINALYSIS DIPSTICK OB
Blood, UA: NEGATIVE
Glucose, UA: NEGATIVE
Ketones, UA: NEGATIVE
Leukocytes, UA: NEGATIVE
Nitrite, UA: NEGATIVE

## 2022-01-16 NOTE — Progress Notes (Signed)
   NURSE VISIT- NST  SUBJECTIVE:  Nichole Lang is a 31 y.o. G2P1001 female at [redacted]w[redacted]d, here for a NST for pregnancy complicated by [facility] and FGR.  She reports active fetal movement, contractions: none, vaginal bleeding: none, membranes: intact.   OBJECTIVE:  BP (!) 140/86   Pulse 94   Wt (!) 302 lb 6.4 oz (137.2 kg)   LMP 04/30/2021   BMI 47.36 kg/m   Appears well, no apparent distress  Results for orders placed or performed in visit on 01/16/22 (from the past 24 hour(s))  POC Urinalysis Dipstick OB   Collection Time: 01/16/22 12:16 PM  Result Value Ref Range   Color, UA     Clarity, UA     Glucose, UA Negative Negative   Bilirubin, UA     Ketones, UA neg    Spec Grav, UA     Blood, UA neg    pH, UA     POC,PROTEIN,UA Small (1+) Negative, Trace, Small (1+), Moderate (2+), Large (3+), 4+   Urobilinogen, UA     Nitrite, UA neg    Leukocytes, UA Negative Negative   Appearance     Odor      NST: FHR baseline 130 bpm, Variability: moderate, Accelerations:present, Decelerations:  Absent= Cat 1/reactive Toco: UI   ASSESSMENT: G2P1001 at [redacted]w[redacted]d with CHTN and FGR NST reactive  PLAN: EFM strip reviewed by Dr. Charlotta Newton   Recommendations: keep next appointment as scheduled    Jobe Marker  01/16/2022 12:59 PM

## 2022-01-19 ENCOUNTER — Other Ambulatory Visit: Payer: Self-pay | Admitting: Obstetrics & Gynecology

## 2022-01-19 DIAGNOSIS — O10919 Unspecified pre-existing hypertension complicating pregnancy, unspecified trimester: Secondary | ICD-10-CM

## 2022-01-20 ENCOUNTER — Ambulatory Visit (INDEPENDENT_AMBULATORY_CARE_PROVIDER_SITE_OTHER): Payer: Medicaid Other

## 2022-01-20 ENCOUNTER — Ambulatory Visit (INDEPENDENT_AMBULATORY_CARE_PROVIDER_SITE_OTHER): Payer: Medicaid Other | Admitting: Obstetrics & Gynecology

## 2022-01-20 ENCOUNTER — Encounter: Payer: Self-pay | Admitting: Obstetrics & Gynecology

## 2022-01-20 VITALS — BP 129/85 | HR 94 | Wt 305.8 lb

## 2022-01-20 DIAGNOSIS — O0990 Supervision of high risk pregnancy, unspecified, unspecified trimester: Secondary | ICD-10-CM

## 2022-01-20 DIAGNOSIS — Z6791 Unspecified blood type, Rh negative: Secondary | ICD-10-CM

## 2022-01-20 DIAGNOSIS — O0993 Supervision of high risk pregnancy, unspecified, third trimester: Secondary | ICD-10-CM

## 2022-01-20 DIAGNOSIS — O26899 Other specified pregnancy related conditions, unspecified trimester: Secondary | ICD-10-CM

## 2022-01-20 DIAGNOSIS — Z3A37 37 weeks gestation of pregnancy: Secondary | ICD-10-CM

## 2022-01-20 DIAGNOSIS — O10919 Unspecified pre-existing hypertension complicating pregnancy, unspecified trimester: Secondary | ICD-10-CM

## 2022-01-20 DIAGNOSIS — D573 Sickle-cell trait: Secondary | ICD-10-CM

## 2022-01-20 NOTE — Progress Notes (Signed)
HIGH-RISK PREGNANCY VISIT Patient name: Nichole Lang MRN [id number]  Date of birth: 1990/10/08 Chief Complaint:   Routine Prenatal Visit  History of Present Illness:   Nichole Lang is a 31 y.o. G20P1001 female at [redacted]w[redacted]d with an Estimated Date of Delivery: 02/04/22 being seen today for ongoing management of a high-risk pregnancy complicated by:  -Chronic HTN -AB neg -FGR  Though recent growth @ [redacted]w[redacted]d- 14%  Today she reports fatigue and pelvic pressure .   Contractions: Irritability. Vag. Bleeding: None.  Movement: Present. denies leaking of fluid.      11/18/2021    9:57 AM 07/28/2021    2:40 PM  Depression screen PHQ 2/9  Decreased Interest 0 0  Down, Depressed, Hopeless 0 0  PHQ - 2 Score 0 0  Altered sleeping 0 0  Tired, decreased energy 1 1  Change in appetite 0 0  Feeling bad or failure about yourself  0 0  Trouble concentrating 0 0  Moving slowly or fidgety/restless 0 0  Suicidal thoughts 0 0  PHQ-9 Score 1 1     Current Outpatient Medications  Medication Instructions   aspirin EC 162 mg, Oral, Daily   Fexofenadine HCl (ALLEGRA PO) Oral   labetalol (NORMODYNE) 200 mg, Oral, 2 times daily   Prenat w/o A Vit-FeFum-FePo-FA (PROVIDA OB PO) 1 tablet, Oral, Daily     Review of Systems:   Pertinent items are noted in HPI Denies abnormal vaginal discharge w/ itching/odor/irritation, headaches, visual changes, shortness of breath, chest pain, abdominal pain, severe nausea/vomiting, or problems with urination or bowel movements unless otherwise stated above. Pertinent History Reviewed:  Reviewed past medical,surgical, social, obstetrical and family history.  Reviewed problem list, medications and allergies. Physical Assessment:   Vitals:   01/20/22 1518 01/20/22 1531  BP: (!) 143/92 129/85  Pulse: (!) 103 94  Weight: (!) 305 lb 12.8 oz (138.7 kg)   Body mass index is 47.9 kg/m.           Physical Examination:   General appearance: alert, well appearing, and  in no distress  Mental status: normal mood, behavior, speech, dress, motor activity, and thought processes  Skin: warm & dry   Extremities: Edema: Mild pitting, slight indentation    Cardiovascular: normal heart rate noted  Respiratory: normal respiratory effort, no distress  Abdomen: gravid, soft, non-tender  Pelvic: Cervical exam deferred  Pt declined- she joked that they would do plenty of checks tonight       Fetal Status:     Movement: Present    Fetal Surveillance Testing today: cephalic,BPP 8/8,anterior placenta gr 3,RI .61,.62,.65,.64=83%,AFI 29 cm,polyhydramnios    Chaperone: N/A    No results found for this or any previous visit (from the past 24 hour(s)).   Assessment & Plan:  High-risk pregnancy: G2P1001 at [redacted]w[redacted]d with an Estimated Date of Delivery: 02/04/22   1) cHTN -doing well with current BP meds -BPP 8/8, polyhydramnios noted on [place] -IOL scheduled for tonight  2) Contraeption -reviewed plan for postpartum BTL  Meds: No orders of the defined types were placed in this encounter.   Labs/procedures today: BPP  Treatment Plan:  as outlined above  Reviewed: Term labor symptoms and general obstetric precautions including but not limited to vaginal bleeding, contractions, leaking of fluid and fetal movement were reviewed in detail with the patient.  All questions were answered. Pt has home bp cuff. Check bp weekly, let us know if >140/90.   Follow-up: Return for 1wk RN BP check and  5-6 wk PPV .   Future Appointments  Date Time Provider Department Center  01/21/2022 12:00 AM [facility] SCHED ROOM [facility] None  01/28/2022 10:10 AM [facility] NURSE [facility]    Orders Placed This Encounter  Procedures   POC Urinalysis Dipstick OB    Myna Hidalgo, DO Attending Obstetrician & Gynecologist, [facility] [employer], [facility]

## 2022-01-20 NOTE — Progress Notes (Signed)
[place] 37+6 wks,cephalic,BPP 8/8,anterior placenta gr 3,RI .61,.62,.65,.64=83%,AFI 29 cm,polyhydramnios

## 2022-01-21 ENCOUNTER — Inpatient Hospital Stay (HOSPITAL_COMMUNITY): Admission: AD | Admit: 2022-01-21 | Payer: Medicaid Other | Source: Home / Self Care | Admitting: Family Medicine

## 2022-01-21 ENCOUNTER — Inpatient Hospital Stay (HOSPITAL_COMMUNITY): Payer: Medicaid Other

## 2022-01-23 ENCOUNTER — Other Ambulatory Visit: Payer: 59

## 2022-01-27 ENCOUNTER — Encounter: Payer: 59 | Admitting: Obstetrics & Gynecology

## 2022-01-27 ENCOUNTER — Encounter: Payer: Self-pay | Admitting: *Deleted

## 2022-01-27 ENCOUNTER — Other Ambulatory Visit: Payer: 59

## 2022-01-28 ENCOUNTER — Other Ambulatory Visit: Payer: Self-pay | Admitting: Advanced Practice Midwife

## 2022-01-28 ENCOUNTER — Ambulatory Visit (INDEPENDENT_AMBULATORY_CARE_PROVIDER_SITE_OTHER): Payer: Medicaid Other | Admitting: *Deleted

## 2022-01-28 DIAGNOSIS — I10 Essential (primary) hypertension: Secondary | ICD-10-CM

## 2022-01-28 DIAGNOSIS — O165 Unspecified maternal hypertension, complicating the puerperium: Secondary | ICD-10-CM

## 2022-01-28 MED ORDER — NIFEDIPINE ER OSMOTIC RELEASE 30 MG PO TB24: 30.0000 mg | ORAL_TABLET | Freq: Every day | ORAL | 2 refills | Status: AC

## 2022-01-28 NOTE — Progress Notes (Signed)
   NURSE VISIT- BLOOD PRESSURE CHECK  SUBJECTIVE:  Nichole Lang is a 31 y.o. G37P1001 female here for BP check. She is postpartum, delivery date 01/21/22     HYPERTENSION ROS:  Postpartum:  Severe headaches that don't go away with tylenol/other medicines: No  Visual changes (seeing spots/double/blurred vision) No  Severe pain under right breast breast or in center of upper chest No  Severe nausea/vomiting No  Taking medicines as instructed yes  OBJECTIVE:  Breastfeeding Yes   Appearance alert, well appearing, and in no distress and oriented to person, place, and time.  ASSESSMENT: Postpartum  blood pressure check  PLAN: Discussed with Derrill Memo, CNM   Recommendations: new prescription will be sent   Follow-up: as scheduled   Alice Rieger  01/28/2022 9:42 AM

## 2022-02-06 ENCOUNTER — Telehealth: Payer: Self-pay | Admitting: *Deleted

## 2022-02-06 NOTE — Telephone Encounter (Signed)
Anderson Malta, [facility] postpartum nurse called. Pt's BP on 9/27 was 140/92 heart rate 80. Pt is taking Nifedipine 30 mg one tablet daily. She is breastfeeding. Scored 2/30 on [place] screen. Left message with pt @ 1:39 pm asking pt if she could check her BP and let us know what it is. Advised can send a MyChart message. If BP is elevated, has a headache or blurred vision, call the after hours nurse line for triage. [place]

## 2022-02-09 ENCOUNTER — Encounter: Payer: Self-pay | Admitting: *Deleted
